# Patient Record
Sex: Female | Born: 1965 | Race: White | Hispanic: No | Marital: Married | Smoking: Former smoker
Health system: Southern US, Community
[De-identification: ages and names within clinical notes are randomized; demographics above are authoritative.]

## PROBLEM LIST (undated history)

## (undated) DIAGNOSIS — I370 Nonrheumatic pulmonary valve stenosis: Secondary | ICD-10-CM

## (undated) DIAGNOSIS — C539 Malignant neoplasm of cervix uteri, unspecified: Secondary | ICD-10-CM

## (undated) HISTORY — PX: CARDIAC SURGERY: SHX584

## (undated) HISTORY — PX: CERVIX SURGERY: SHX593

---

## 2017-03-03 ENCOUNTER — Emergency Department (HOSPITAL_COMMUNITY): Payer: Medicaid Other

## 2017-03-03 ENCOUNTER — Inpatient Hospital Stay (HOSPITAL_COMMUNITY): Payer: Medicaid Other

## 2017-03-03 ENCOUNTER — Encounter (HOSPITAL_COMMUNITY): Payer: Self-pay | Admitting: *Deleted

## 2017-03-03 ENCOUNTER — Inpatient Hospital Stay (HOSPITAL_COMMUNITY)
Admission: EM | Admit: 2017-03-03 | Discharge: 2017-03-08 | DRG: 872 | Disposition: A | Discharge status: Home / Self Care | Payer: Medicaid Other | Attending: Internal Medicine | Admitting: Internal Medicine

## 2017-03-03 ENCOUNTER — Other Ambulatory Visit: Payer: Self-pay

## 2017-03-03 DIAGNOSIS — Z8541 Personal history of malignant neoplasm of cervix uteri: Secondary | ICD-10-CM

## 2017-03-03 DIAGNOSIS — Z791 Long term (current) use of non-steroidal anti-inflammatories (NSAID): Secondary | ICD-10-CM | POA: Diagnosis not present

## 2017-03-03 DIAGNOSIS — C787 Secondary malignant neoplasm of liver and intrahepatic bile duct: Secondary | ICD-10-CM | POA: Diagnosis present

## 2017-03-03 DIAGNOSIS — R7989 Other specified abnormal findings of blood chemistry: Secondary | ICD-10-CM | POA: Diagnosis present

## 2017-03-03 DIAGNOSIS — R160 Hepatomegaly, not elsewhere classified: Secondary | ICD-10-CM

## 2017-03-03 DIAGNOSIS — R945 Abnormal results of liver function studies: Secondary | ICD-10-CM

## 2017-03-03 DIAGNOSIS — R8281 Pyuria: Secondary | ICD-10-CM | POA: Diagnosis present

## 2017-03-03 DIAGNOSIS — E871 Hypo-osmolality and hyponatremia: Secondary | ICD-10-CM | POA: Diagnosis present

## 2017-03-03 DIAGNOSIS — R1011 Right upper quadrant pain: Secondary | ICD-10-CM | POA: Diagnosis present

## 2017-03-03 DIAGNOSIS — E860 Dehydration: Secondary | ICD-10-CM | POA: Diagnosis present

## 2017-03-03 DIAGNOSIS — Z87891 Personal history of nicotine dependence: Secondary | ICD-10-CM

## 2017-03-03 DIAGNOSIS — K760 Fatty (change of) liver, not elsewhere classified: Secondary | ICD-10-CM | POA: Diagnosis present

## 2017-03-03 DIAGNOSIS — N179 Acute kidney failure, unspecified: Secondary | ICD-10-CM | POA: Diagnosis present

## 2017-03-03 DIAGNOSIS — A419 Sepsis, unspecified organism: Secondary | ICD-10-CM | POA: Diagnosis present

## 2017-03-03 DIAGNOSIS — N390 Urinary tract infection, site not specified: Secondary | ICD-10-CM | POA: Diagnosis present

## 2017-03-03 DIAGNOSIS — E872 Acidosis, unspecified: Secondary | ICD-10-CM | POA: Diagnosis present

## 2017-03-03 DIAGNOSIS — K5909 Other constipation: Secondary | ICD-10-CM | POA: Diagnosis present

## 2017-03-03 DIAGNOSIS — Z801 Family history of malignant neoplasm of trachea, bronchus and lung: Secondary | ICD-10-CM

## 2017-03-03 DIAGNOSIS — R918 Other nonspecific abnormal finding of lung field: Secondary | ICD-10-CM | POA: Diagnosis present

## 2017-03-03 DIAGNOSIS — K8020 Calculus of gallbladder without cholecystitis without obstruction: Secondary | ICD-10-CM | POA: Diagnosis present

## 2017-03-03 DIAGNOSIS — C799 Secondary malignant neoplasm of unspecified site: Secondary | ICD-10-CM | POA: Diagnosis present

## 2017-03-03 DIAGNOSIS — C539 Malignant neoplasm of cervix uteri, unspecified: Secondary | ICD-10-CM | POA: Diagnosis present

## 2017-03-03 DIAGNOSIS — D72829 Elevated white blood cell count, unspecified: Secondary | ICD-10-CM | POA: Diagnosis present

## 2017-03-03 DIAGNOSIS — K769 Liver disease, unspecified: Secondary | ICD-10-CM

## 2017-03-03 DIAGNOSIS — R109 Unspecified abdominal pain: Secondary | ICD-10-CM

## 2017-03-03 DIAGNOSIS — I959 Hypotension, unspecified: Secondary | ICD-10-CM | POA: Diagnosis present

## 2017-03-03 HISTORY — DX: Malignant neoplasm of cervix uteri, unspecified: C53.9

## 2017-03-03 HISTORY — DX: Nonrheumatic pulmonary valve stenosis: I37.0

## 2017-03-03 LAB — URINALYSIS, ROUTINE W REFLEX MICROSCOPIC
Bilirubin Urine: NEGATIVE
GLUCOSE, UA: NEGATIVE mg/dL
Ketones, ur: NEGATIVE mg/dL
Leukocytes, UA: NEGATIVE
NITRITE: POSITIVE — AB
PH: 5 (ref 5.0–8.0)
Protein, ur: NEGATIVE mg/dL
SPECIFIC GRAVITY, URINE: 1.013 (ref 1.005–1.030)

## 2017-03-03 LAB — COMPREHENSIVE METABOLIC PANEL
ALBUMIN: 3.4 g/dL — AB (ref 3.5–5.0)
ALBUMIN: 2.8 g/dL — AB (ref 3.5–5.0)
ALK PHOS: 227 U/L — AB (ref 38–126)
ALK PHOS: 181 U/L — AB (ref 38–126)
ALT: 54 U/L (ref 14–54)
ALT: 48 U/L (ref 14–54)
ANION GAP: 16 — AB (ref 5–15)
ANION GAP: 11 (ref 5–15)
AST: 174 U/L — ABNORMAL HIGH (ref 15–41)
AST: 146 U/L — AB (ref 15–41)
BILIRUBIN TOTAL: 2.2 mg/dL — AB (ref 0.3–1.2)
BILIRUBIN TOTAL: 1.6 mg/dL — AB (ref 0.3–1.2)
BUN: 18 mg/dL (ref 6–20)
BUN: 14 mg/dL (ref 6–20)
CALCIUM: 8.8 mg/dL — AB (ref 8.9–10.3)
CALCIUM: 7.8 mg/dL — AB (ref 8.9–10.3)
CO2: 20 mmol/L — ABNORMAL LOW (ref 22–32)
CO2: 21 mmol/L — ABNORMAL LOW (ref 22–32)
CREATININE: 1.3 mg/dL — AB (ref 0.44–1.00)
Chloride: 88 mmol/L — ABNORMAL LOW (ref 101–111)
Chloride: 99 mmol/L — ABNORMAL LOW (ref 101–111)
Creatinine, Ser: 0.99 mg/dL (ref 0.44–1.00)
GFR calc non Af Amer: 46 mL/min — ABNORMAL LOW (ref >60)
GFR calc non Af Amer: >60 mL/min (ref >60)
GFR, EST AFRICAN AMERICAN: 54 mL/min — AB (ref >60)
GLUCOSE: 105 mg/dL — AB (ref 65–99)
Glucose, Bld: 106 mg/dL — ABNORMAL HIGH (ref 65–99)
Potassium: 4.9 mmol/L (ref 3.5–5.1)
Potassium: 4.4 mmol/L (ref 3.5–5.1)
Sodium: 124 mmol/L — ABNORMAL LOW (ref 135–145)
Sodium: 131 mmol/L — ABNORMAL LOW (ref 135–145)
TOTAL PROTEIN: 7.5 g/dL (ref 6.5–8.1)
TOTAL PROTEIN: 6.7 g/dL (ref 6.5–8.1)

## 2017-03-03 LAB — OSMOLALITY, URINE: OSMOLALITY UR: 189 mosm/kg — AB (ref 300–900)

## 2017-03-03 LAB — CBC
HCT: 35.4 % — ABNORMAL LOW (ref 36.0–46.0)
HEMOGLOBIN: 12.1 g/dL (ref 12.0–15.0)
MCH: 26.5 pg (ref 26.0–34.0)
MCHC: 34.2 g/dL (ref 30.0–36.0)
MCV: 77.6 fL — ABNORMAL LOW (ref 78.0–100.0)
PLATELETS: 303 10*3/uL (ref 150–400)
RBC: 4.56 MIL/uL (ref 3.87–5.11)
RDW: 15.6 % — ABNORMAL HIGH (ref 11.5–15.5)
WBC: 23.9 10*3/uL — ABNORMAL HIGH (ref 4.0–10.5)

## 2017-03-03 LAB — LACTIC ACID, PLASMA: Lactic Acid, Venous: 2.9 mmol/L (ref 0.5–1.9)

## 2017-03-03 LAB — I-STAT CG4 LACTIC ACID, ED: Lactic Acid, Venous: 4.55 mmol/L (ref 0.5–1.9)

## 2017-03-03 LAB — PROTIME-INR
INR: 1.18
PROTHROMBIN TIME: 14.9 s (ref 11.4–15.2)

## 2017-03-03 LAB — OSMOLALITY: OSMOLALITY: 281 mosm/kg (ref 275–295)

## 2017-03-03 LAB — LIPASE, BLOOD: Lipase: 23 U/L (ref 11–51)

## 2017-03-03 MED ORDER — CEFTRIAXONE SODIUM 1 G IJ SOLR
1.0000 g | INTRAMUSCULAR | Status: DC
  Administered 2017-03-03 – 2017-03-04 (×2): 1 g via INTRAVENOUS
  Filled 2017-03-03 (×3): qty 10

## 2017-03-03 MED ORDER — SODIUM CHLORIDE 0.9 % IV BOLUS (SEPSIS)
1000.0000 mL | Freq: Once | INTRAVENOUS | Status: AC
  Administered 2017-03-03: 1000 mL via INTRAVENOUS

## 2017-03-03 MED ORDER — POLYETHYLENE GLYCOL 3350 17 G PO PACK
17.0000 g | PACK | Freq: Every day | ORAL | Status: DC
  Filled 2017-03-03 (×2): qty 1

## 2017-03-03 MED ORDER — MORPHINE SULFATE (PF) 4 MG/ML IV SOLN
4.0000 mg | Freq: Once | INTRAVENOUS | Status: AC
  Administered 2017-03-03: 4 mg via INTRAVENOUS
  Filled 2017-03-03: qty 1

## 2017-03-03 MED ORDER — DOCUSATE SODIUM 100 MG PO CAPS
100.0000 mg | ORAL_CAPSULE | Freq: Two times a day (BID) | ORAL | Status: DC
  Administered 2017-03-03: 100 mg via ORAL
  Filled 2017-03-03 (×2): qty 1

## 2017-03-03 MED ORDER — SODIUM CHLORIDE 0.9 % IV BOLUS (SEPSIS)
500.0000 mL | Freq: Once | INTRAVENOUS | Status: AC
  Administered 2017-03-03: 500 mL via INTRAVENOUS

## 2017-03-03 MED ORDER — HEPARIN SODIUM (PORCINE) 5000 UNIT/ML IJ SOLN
5000.0000 [IU] | Freq: Three times a day (TID) | INTRAMUSCULAR | Status: AC
  Administered 2017-03-03 – 2017-03-04 (×3): 5000 [IU] via SUBCUTANEOUS
  Filled 2017-03-03 (×4): qty 1

## 2017-03-03 MED ORDER — ACETAMINOPHEN 325 MG PO TABS: 650.0000 mg | ORAL_TABLET | Freq: Four times a day (QID) | ORAL | Status: DC | PRN

## 2017-03-03 MED ORDER — BISACODYL 10 MG RE SUPP: 10.0000 mg | Freq: Every day | RECTAL | Status: DC | PRN

## 2017-03-03 MED ORDER — PIPERACILLIN-TAZOBACTAM 3.375 G IVPB 30 MIN
3.3750 g | Freq: Once | INTRAVENOUS | Status: AC
  Administered 2017-03-03: 3.375 g via INTRAVENOUS
  Filled 2017-03-03: qty 50

## 2017-03-03 MED ORDER — INFLUENZA VAC SPLIT QUAD 0.5 ML IM SUSY
0.5000 mL | PREFILLED_SYRINGE | INTRAMUSCULAR | Status: DC
  Filled 2017-03-03: qty 0.5

## 2017-03-03 MED ORDER — ONDANSETRON HCL 4 MG/2ML IJ SOLN
4.0000 mg | Freq: Four times a day (QID) | INTRAMUSCULAR | Status: DC | PRN
  Administered 2017-03-03: 4 mg via INTRAVENOUS
  Filled 2017-03-03: qty 2

## 2017-03-03 MED ORDER — ONDANSETRON HCL 4 MG PO TABS: 4.0000 mg | ORAL_TABLET | Freq: Four times a day (QID) | ORAL | Status: DC | PRN

## 2017-03-03 MED ORDER — MORPHINE SULFATE (PF) 4 MG/ML IV SOLN
2.0000 mg | INTRAVENOUS | Status: DC | PRN
  Administered 2017-03-03 – 2017-03-06 (×13): 4 mg via INTRAVENOUS
  Filled 2017-03-03 (×13): qty 1

## 2017-03-03 MED ORDER — GADOBENATE DIMEGLUMINE 529 MG/ML IV SOLN
16.0000 mL | Freq: Once | INTRAVENOUS | Status: AC | PRN
  Administered 2017-03-03: 16 mL via INTRAVENOUS

## 2017-03-03 MED ORDER — ONDANSETRON HCL 4 MG/2ML IJ SOLN
4.0000 mg | Freq: Once | INTRAMUSCULAR | Status: AC
  Administered 2017-03-03: 4 mg via INTRAVENOUS
  Filled 2017-03-03: qty 2

## 2017-03-03 MED ORDER — IOPAMIDOL (ISOVUE-300) INJECTION 61%
INTRAVENOUS | Status: AC
  Filled 2017-03-03: qty 100

## 2017-03-03 MED ORDER — IOPAMIDOL (ISOVUE-300) INJECTION 61%
100.0000 mL | Freq: Once | INTRAVENOUS | Status: AC | PRN
Start: 2017-03-03 — End: 2017-03-03
  Administered 2017-03-03: 100 mL via INTRAVENOUS

## 2017-03-03 MED ORDER — MORPHINE SULFATE (PF) 2 MG/ML IV SOLN: 2.0000 mg | INTRAVENOUS | Status: DC | PRN

## 2017-03-03 MED ORDER — SODIUM CHLORIDE 0.9 % IV SOLN
INTRAVENOUS | Status: DC
  Administered 2017-03-03 – 2017-03-06 (×8): via INTRAVENOUS

## 2017-03-03 MED ORDER — HYDROCODONE-ACETAMINOPHEN 5-325 MG PO TABS
1.0000 | ORAL_TABLET | ORAL | Status: DC | PRN
  Administered 2017-03-03 – 2017-03-04 (×3): 1 via ORAL
  Filled 2017-03-03 (×4): qty 1

## 2017-03-03 MED ORDER — SODIUM CHLORIDE 0.9% FLUSH
3.0000 mL | Freq: Two times a day (BID) | INTRAVENOUS | Status: DC
  Administered 2017-03-05 – 2017-03-07 (×3): 3 mL via INTRAVENOUS

## 2017-03-03 MED ORDER — ACETAMINOPHEN 650 MG RE SUPP: 650.0000 mg | Freq: Four times a day (QID) | RECTAL | Status: DC | PRN

## 2017-03-03 MED ORDER — PROMETHAZINE HCL 25 MG/ML IJ SOLN
12.5000 mg | Freq: Four times a day (QID) | INTRAMUSCULAR | Status: DC | PRN
  Administered 2017-03-03: 12.5 mg via INTRAVENOUS
  Filled 2017-03-03: qty 1

## 2017-03-03 NOTE — ED Notes (Signed)
EDP Tegeler notified of elevated lactic 4.55

## 2017-03-03 NOTE — H&P (Signed)
[facility] History and Physical   Patient: Marisa Kim TKZ:[id number]   PCP: Patient, No Pcp Per DOB: 12-16-1965   DOA: 03/03/2017   DOS: 03/03/2017   DOS: the patient was seen and examined on 03/03/2017  Patient coming from: The patient is coming from home.  Chief Complaint: Right upper quadrant pain  HPI: Marisa Kim is a 51 y.o. female with Past medical history of cervical cancer, pulmonary stenosis S/P open heart surgery at 51 year old age. Patient presents with complaints of right upper quadrant pain. Patient mentions that since last one month she has been having on and off right upper quadrant pain resolving on its own and lasting for a day or 2. Pain was mild and was not bothering her day-to-day activity. At 3 days ago she started having severe pain in the right area along with nausea and the patient had nothing to eat or drink since last 3 days. Patient denies having any complaints of fever or chills. She is unsure whether the pain started after eating heavy fatty meal. She mentions that she has chronic constipation and also currently constipated. She mentions she is passing gas. Denies any blood in the stool denies any cough denies any shortness of breath denies any chest pain. No swelling of her legs. She never had any colonoscopy, never had mammograms. And does not have any recent follow-up for cervical cancer or Pap smear as well.  ED Course: Presents with right upper quadrant pain, ultrasound was performed which was negative for cholecystitis but showed multiple metastatic lesions. CT abdomen was performed which continued to show multiple metastatic lesions without any choledocholithiasis. Lab work was showing lactic acidosis, acute kidney injury, leukocytosis patient was given IV Zosyn and was referred for admission.  At her baseline ambulates without any support And is independent for most of her ADL; manages her medication on her own.  Review of Systems: as  mentioned in the history of present illness.  All other systems reviewed and are negative.  Past Medical History:  Diagnosis Date  . Cervical ca ([place])   . Pulmonary stenosis    Past Surgical History:  Procedure Laterality Date  . CARDIAC SURGERY    . CERVIX SURGERY     30 years ago   Social History:  reports that she quit smoking about 7 years ago. She has a 60.00 pack-year smoking history. She has never used smokeless tobacco. She reports that she drinks alcohol. She reports that she does not use drugs.  No Known Allergies   Family History  Problem Relation Age of Onset  . Lung cancer Mother 31     Prior to Admission medications   Medication Sig Start Date End Date Taking? Authorizing Provider  naproxen sodium (ANAPROX) 220 MG tablet Take 220 mg by mouth 2 (two) times daily as needed (pain).   Yes [provider]    Physical Exam: Vitals:   03/03/17 0753 03/03/17 0807 03/03/17 1225 03/03/17 1611  BP: (!) 187/98  125/67 (!) 132/92  Pulse: 91  98 85  Resp: 18  (!) 28 16  Temp: 97.9 F (36.6 C)   98.1 F (36.7 C)  TempSrc:    Oral  SpO2: 97%  97% 97%  Weight:  77.1 kg (170 lb)    Height:  5' (1.524 m)  5' (1.524 m)    General: Alert, Awake and Oriented to Time, Place and Person. Appear in moderate distress, affect appropriate Eyes: PERRL, Conjunctiva normal ENT: Oral Mucosa clear moist. Neck: no  JVD, no Abnormal Mass Or lumps Cardiovascular: S1 and S2 Present, no Murmur, Peripheral Pulses Present Respiratory: normal respiratory effort, Bilateral Air entry equal and Decreased, no use of accessory muscle, Clear to Auscultation, no Crackles, no wheezes Abdomen: Bowel Sound present, Soft and mild right upper quadrant and epifgastric  tenderness, no hernia Skin: no redness, no Rash, no induration Extremities: trace Pedal edema, no calf tenderness Neurologic: Grossly no focal neuro deficit. Bilaterally Equal motor strength  Labs on Admission:  CBC:  Recent  Labs Lab 03/03/17 0821  WBC 23.9*  HGB 12.1  HCT 35.4*  MCV 77.6*  PLT 149   Basic Metabolic Panel:  Recent Labs Lab 03/03/17 0821  NA 124*  K 4.9  CL 88*  CO2 20*  GLUCOSE 105*  BUN 18  CREATININE 1.30*  CALCIUM 8.8*   GFR: Estimated Creatinine Clearance: 46.4 mL/min (A) (by C-G formula based on SCr of 1.3 mg/dL (H)). Liver Function Tests:  Recent Labs Lab 03/03/17 0821  AST 174*  ALT 54  ALKPHOS 227*  BILITOT 2.2*  PROT 7.5  ALBUMIN 3.4*    Recent Labs Lab 03/03/17 0821  LIPASE 23   No results for input(s): AMMONIA in the last 168 hours. Coagulation Profile:  Recent Labs Lab 03/03/17 0821  INR 1.18   Cardiac Enzymes: No results for input(s): CKTOTAL, CKMB, CKMBINDEX, TROPONINI in the last 168 hours. BNP (last 3 results) No results for input(s): PROBNP in the last 8760 hours. HbA1C: No results for input(s): HGBA1C in the last 72 hours. CBG: No results for input(s): GLUCAP in the last 168 hours. Lipid Profile: No results for input(s): CHOL, HDL, LDLCALC, TRIG, CHOLHDL, LDLDIRECT in the last 72 hours. Thyroid Function Tests: No results for input(s): TSH, T4TOTAL, FREET4, T3FREE, THYROIDAB in the last 72 hours. Anemia Panel: No results for input(s): VITAMINB12, FOLATE, FERRITIN, TIBC, IRON, RETICCTPCT in the last 72 hours. Urine analysis:    Component Value Date/Time   COLORURINE YELLOW 03/03/2017 1225   APPEARANCEUR CLEAR 03/03/2017 1225   LABSPEC 1.013 03/03/2017 1225   PHURINE 5.0 03/03/2017 1225   GLUCOSEU NEGATIVE 03/03/2017 1225   HGBUR SMALL (A) 03/03/2017 1225   BILIRUBINUR NEGATIVE 03/03/2017 1225   KETONESUR NEGATIVE 03/03/2017 1225   PROTEINUR NEGATIVE 03/03/2017 1225   NITRITE POSITIVE (A) 03/03/2017 1225   LEUKOCYTESUR NEGATIVE 03/03/2017 1225    Radiological Exams on Admission: Dg Chest 2 View  Result Date: 03/03/2017 CLINICAL DATA:  51 year old female with a history of leukocytosis. No current chest complaints EXAM:  CHEST  2 VIEW COMPARISON:  None. FINDINGS: The heart size and mediastinal contours are within normal limits. Both lungs are clear. Healed right-sided rib fractures. IMPRESSION: No radiographic evidence of acute cardiopulmonary disease Electronically Signed   By: Corrie Mckusick D.O.   On: 03/03/2017 11:01   Ct Chest Wo Contrast  Result Date: 03/03/2017 CLINICAL DATA:  Lung mass. EXAM: CT CHEST WITHOUT CONTRAST TECHNIQUE: Multidetector CT imaging of the chest was performed following the standard protocol without IV contrast. COMPARISON:  CT scan and radiographs of same day. FINDINGS: Cardiovascular: Atherosclerosis of thoracic aorta is noted without aneurysm formation. Enlarged pulmonary artery is noted suggesting pulmonary artery hypertension. Coronary artery calcifications are noted. No pericardial effusion is noted. Mediastinum/Nodes: Thyroid gland and esophagus appear normal. 1 cm pretracheal lymph node is noted concerning for metastatic disease per 1 cm right axillary lymph node is noted concerning for possible metastatic disease. Lungs/Pleura: No pneumothorax or pleural effusion is noted. Left lung is clear. 1.9  x 1.6 cm spiculated density with pleural tail is noted in the right lower lobe posteriorly concerning for malignancy. Upper Abdomen: Multiple low densities are noted in hepatic parenchyma concerning for metastatic disease. Musculoskeletal: No chest wall mass or suspicious bone lesions identified. IMPRESSION: 1.9 x 1.6 cm spiculated density seen in right lower lobe is noted concerning for malignancy. PET scan is recommended for further evaluation. Enlarged pretracheal and right axillary adenopathy is noted concerning for possible metastatic disease. Hepatic metastatic lesions are again noted. Enlarged pulmonary artery is noted suggesting pulmonary artery hypertension. Coronary artery calcifications are noted. Aortic Atherosclerosis (ICD10-I70.0). Electronically Signed   By: Marijo Conception, M.D.   On:  03/03/2017 14:23   Ct Abdomen Pelvis W Contrast  Result Date: 03/03/2017 CLINICAL DATA:  Nausea, acute vomiting, non ileus. EXAM: CT ABDOMEN AND PELVIS WITH CONTRAST TECHNIQUE: Multidetector CT imaging of the abdomen and pelvis was performed using the standard protocol following bolus administration of intravenous contrast. CONTRAST:  137mL ISOVUE-300 IOPAMIDOL (ISOVUE-300) INJECTION 61% COMPARISON:  None. FINDINGS: Lower chest: Elongated irregular nodular density aligned along the bronchovascular tree in the right lower lobe with dominant nodular component measuring 21 mm. Hepatobiliary: The liver is enlarged and lobulated. There are numerous areas of low-density, some with a rounded masslike appearance and others more confluent in the subcapsular far right and left lobes. Suspect this is hypoenhancing metastatic disease with superimposed steatosis. Cholelithiasis.No evidence of biliary obstruction or stone. Pancreas: Unremarkable. Spleen: Unremarkable. Adrenals/Urinary Tract: Negative adrenals. No hydronephrosis or stone. Unremarkable bladder. Stomach/Bowel: No primary mass lesion is seen. Mild distal colonic diverticulosis. No appendicitis. Vascular/Lymphatic: No acute vascular abnormality. No mass or adenopathy. Reproductive:No pathologic findings. Other: Trace pelvic fluid. Musculoskeletal: No acute abnormalities. Smoothly contoured subcutaneous nodule in the right gluteal fat is likely a dermal inclusion cyst. IMPRESSION: 1. Numerous hepatic masses with a metastatic pattern. No clear primary. There is superimposed patchy hepatic steatosis and if biopsy is needed a preoperative MRI could ensure well directed biopsy. 2. Elongated spiculated nodule in the right lower lobe with dominant nodular component measuring 2 cm. This could be a primary or metastatic neoplasm. Full chest CT or PET recommended. 3. Cholelithiasis without cholecystitis. Electronically Signed   By: Monte Fantasia M.D.   On: 03/03/2017  12:30   US Abdomen Limited Ruq  Result Date: 03/03/2017 CLINICAL DATA:  Right upper quadrant pain and nausea EXAM: ULTRASOUND ABDOMEN LIMITED RIGHT UPPER QUADRANT COMPARISON:  None. FINDINGS: Gallbladder: Layering gallstone without wall thickening focal tenderness. Common bile duct: Diameter: 4 mm Liver: Diffuse liver heterogeneity and lobulation with the appearance of multiple masses. Patent and antegrade flow in the main portal vein. Portal vein is patent on color Doppler imaging with normal direction of blood flow towards the liver. IMPRESSION: 1. Very heterogeneous liver, likely widespread hepatic metastatic disease. Recommend abdomen and pelvis CT with contrast. 2. Cholelithiasis without cholecystitis. Electronically Signed   By: Monte Fantasia M.D.   On: 03/03/2017 10:49   EKG: Independently reviewed. normal sinus rhythm, nonspecific ST and T waves changes.  Assessment/Plan 1. Right upper quadrant pain. Cholelithiasis. Patient presented with complaints of 3 days of right upper quadrant pain with nausea and vomiting poor by mouth intake. Patient is to be significantly dehydrated. Patient has cholelithiasis on the CT scan. Discussed with general surgery recommended no surgical intervention for now as they do not think that patient's pain is coming from cholelithiasis. No evidence of CBD involvement or choledocholithiasis or cholecystitis on ultrasound.  2. SIRS. Possible UTI.  Patient with tachycardia, leukocytosis, tachypnea, possible UTI meets sepsis criteria although does not have any fever or chills. I suspect that the leukocytosis is primarily driven by severe dehydration but a possibility of infection cannot be ruled out. We will give her IV ceftriaxone to cover empirically until the workup is negative.  3. Metastatic liver lesions. Right lower lobe spiculated mass 2 cm in size. History of cervical cancer. Patient has not have any colonoscopy or mammogram. She had history of  cervical cancer 30 years ago for which she underwent surgery but does not have any follow-up about that as well. Presents with right upper quadrant pain and ultrasound as well as CT scan is showing multiple metastatic liver lesions. Primary is unclear but possible lung given the presence of a right lung mass. IR consulted for CT-guided biopsy of the liver. MRI ordered as per recommendation from radiology.  4. Hyponatremia. Likely from dehydration and poor by mouth intake. Continue IV hydration and recheck BMP. Also check osmolality,  5. Elevated LFT. Monitor. Avoid liver toxic medications.  Nutrition: Regular diet clear liquids for now. DVT Prophylaxis: subcutaneous Heparin  Advance goals of care discussion: Full code   Consults: IR  Family Communication: family was present at bedside, at the time of interview.  Opportunity was given to ask question and all questions were answered satisfactorily.  Disposition: Admitted as inpatient, tmed-surge unit. Likely to be discharged home, in 3-4 days.  Author: Berle Mull, MD [facility] Pager: [phone] 03/03/2017  If 7PM-7AM, please contact night-coverage [email or web address] Password [email or web address]

## 2017-03-03 NOTE — Progress Notes (Signed)
CRITICAL VALUE ALERT  Critical Value: Lactic acid 2.9 Date & Time Notied:  9/29 @ 1750  Provider Notified: Dr Posey Pronto   Orders Received/Actions taken:Return call at 1800 Dr Posey Pronto

## 2017-03-03 NOTE — ED Triage Notes (Signed)
Pin in rt abd near flank are with nausea and vomiting, has become worse last few days, Radiates toward rt neck area

## 2017-03-03 NOTE — ED Provider Notes (Signed)
[place] DEPT Provider Note   CSN: [id number] Arrival date & time: 03/03/17  0745     History   Chief Complaint Chief Complaint  Patient presents with  . Abdominal Pain  . Nausea  . Emesis    HPI Marisa Kim is a 51 y.o. female.  The history is provided by the patient and medical records. No language interpreter was used.  Abdominal Pain   This is a new problem. The current episode started more than 2 days ago. The problem occurs constantly. The problem has not changed since onset.The pain is associated with an unknown factor. The pain is located in the RUQ. The quality of the pain is aching, cramping and sharp. The pain is at a severity of 10/10. The pain is severe. Associated symptoms include nausea and vomiting. Pertinent negatives include fever, diarrhea, melena, constipation, dysuria, hematuria and headaches. Nothing aggravates the symptoms. Nothing relieves the symptoms.    Past Medical History:  Diagnosis Date  . Cervical ca ([place])   . Pulmonary stenosis     There are no active problems to display for this patient.   Past Surgical History:  Procedure Laterality Date  . CARDIAC SURGERY    . CERVIX SURGERY      OB History    No data available       Home Medications    Prior to Admission medications   Not on File    Family History No family history on file.  Social History Social History  Substance Use Topics  . Smoking status: Never Smoker  . Smokeless tobacco: Never Used  . Alcohol use Yes     Comment: occ     Allergies   Patient has no known allergies.   Review of Systems Review of Systems  Constitutional: Negative for chills, diaphoresis, fatigue and fever.  HENT: Negative for congestion and rhinorrhea.   Respiratory: Negative for cough, chest tightness, shortness of breath, wheezing and stridor.   Cardiovascular: Negative for chest pain and palpitations.  Gastrointestinal: Positive for abdominal pain, nausea and vomiting.  Negative for abdominal distention, constipation, diarrhea and melena.  Genitourinary: Negative for dysuria, enuresis, flank pain and hematuria.  Musculoskeletal: Negative for back pain, neck pain and neck stiffness.  Skin: Negative for rash and wound.  Neurological: Negative for weakness, light-headedness and headaches.  Psychiatric/Behavioral: Negative for agitation.  All other systems reviewed and are negative.    Physical Exam Updated Vital Signs BP (!) 187/98   Pulse 91   Temp 97.9 F (36.6 C)   Resp 18   Ht 5' (1.524 m)   Wt 77.1 kg (170 lb)   SpO2 97%   BMI 33.20 kg/m   Physical Exam  Constitutional: She is oriented to person, place, and time. She appears well-developed and well-nourished. No distress.  HENT:  Head: Normocephalic and atraumatic.  Mouth/Throat: Oropharynx is clear and moist. No oropharyngeal exudate.  Eyes: Pupils are equal, round, and reactive to light. Conjunctivae and EOM are normal.  Neck: Normal range of motion. Neck supple.  Cardiovascular: Normal rate and regular rhythm.   Murmur heard. Pulmonary/Chest: Effort normal and breath sounds normal. No stridor. No respiratory distress. She has no wheezes. She exhibits no tenderness.  Abdominal: Soft. Normal appearance. There is tenderness in the right upper quadrant and epigastric area. There is no rigidity, no rebound, no guarding and no CVA tenderness.    Musculoskeletal: She exhibits no edema or tenderness.  Neurological: She is alert and oriented to person, place, and  time. No sensory deficit. She exhibits normal muscle tone.  Skin: Skin is warm and dry. Capillary refill takes less than 2 seconds. No rash noted. She is not diaphoretic. No erythema.  Psychiatric: She has a normal mood and affect.  Nursing note and vitals reviewed.    ED Treatments / Results  Labs (all labs ordered are listed, but only abnormal results are displayed) Labs Reviewed  COMPREHENSIVE METABOLIC PANEL - Abnormal;  Notable for the following:       Result Value   Sodium 124 (*)    Chloride 88 (*)    CO2 20 (*)    Glucose, Bld 105 (*)    Creatinine, Ser 1.30 (*)    Calcium 8.8 (*)    Albumin 3.4 (*)    AST 174 (*)    Alkaline Phosphatase 227 (*)    Total Bilirubin 2.2 (*)    GFR calc non Af Amer 46 (*)    GFR calc Af Amer 54 (*)    Anion gap 16 (*)    All other components within normal limits  CBC - Abnormal; Notable for the following:    WBC 23.9 (*)    HCT 35.4 (*)    MCV 77.6 (*)    RDW 15.6 (*)    All other components within normal limits  URINALYSIS, ROUTINE W REFLEX MICROSCOPIC - Abnormal; Notable for the following:    Hgb urine dipstick SMALL (*)    Nitrite POSITIVE (*)    Bacteria, UA MANY (*)    Squamous Epithelial / LPF 0-5 (*)    All other components within normal limits  I-STAT CG4 LACTIC ACID, ED - Abnormal; Notable for the following:    Lactic Acid, Venous 4.55 (*)    All other components within normal limits  CULTURE, BLOOD (ROUTINE X 2)  CULTURE, BLOOD (ROUTINE X 2)  LIPASE, BLOOD  PROTIME-INR  I-STAT CG4 LACTIC ACID, ED    EKG  EKG Interpretation None       Radiology Dg Chest 2 View  Result Date: 03/03/2017 CLINICAL DATA:  51 year old female with a history of leukocytosis. No current chest complaints EXAM: CHEST  2 VIEW COMPARISON:  None. FINDINGS: The heart size and mediastinal contours are within normal limits. Both lungs are clear. Healed right-sided rib fractures. IMPRESSION: No radiographic evidence of acute cardiopulmonary disease Electronically Signed   By: Corrie Mckusick D.O.   On: 03/03/2017 11:01   Ct Abdomen Pelvis W Contrast  Result Date: 03/03/2017 CLINICAL DATA:  Nausea, acute vomiting, non ileus. EXAM: CT ABDOMEN AND PELVIS WITH CONTRAST TECHNIQUE: Multidetector CT imaging of the abdomen and pelvis was performed using the standard protocol following bolus administration of intravenous contrast. CONTRAST:  144mL ISOVUE-300 IOPAMIDOL (ISOVUE-300)  INJECTION 61% COMPARISON:  None. FINDINGS: Lower chest: Elongated irregular nodular density aligned along the bronchovascular tree in the right lower lobe with dominant nodular component measuring 21 mm. Hepatobiliary: The liver is enlarged and lobulated. There are numerous areas of low-density, some with a rounded masslike appearance and others more confluent in the subcapsular far right and left lobes. Suspect this is hypoenhancing metastatic disease with superimposed steatosis. Cholelithiasis.No evidence of biliary obstruction or stone. Pancreas: Unremarkable. Spleen: Unremarkable. Adrenals/Urinary Tract: Negative adrenals. No hydronephrosis or stone. Unremarkable bladder. Stomach/Bowel: No primary mass lesion is seen. Mild distal colonic diverticulosis. No appendicitis. Vascular/Lymphatic: No acute vascular abnormality. No mass or adenopathy. Reproductive:No pathologic findings. Other: Trace pelvic fluid. Musculoskeletal: No acute abnormalities. Smoothly contoured subcutaneous nodule in the right  gluteal fat is likely a dermal inclusion cyst. IMPRESSION: 1. Numerous hepatic masses with a metastatic pattern. No clear primary. There is superimposed patchy hepatic steatosis and if biopsy is needed a preoperative MRI could ensure well directed biopsy. 2. Elongated spiculated nodule in the right lower lobe with dominant nodular component measuring 2 cm. This could be a primary or metastatic neoplasm. Full chest CT or PET recommended. 3. Cholelithiasis without cholecystitis. Electronically Signed   By: Monte Fantasia M.D.   On: 03/03/2017 12:30   US Abdomen Limited Ruq  Result Date: 03/03/2017 CLINICAL DATA:  Right upper quadrant pain and nausea EXAM: ULTRASOUND ABDOMEN LIMITED RIGHT UPPER QUADRANT COMPARISON:  None. FINDINGS: Gallbladder: Layering gallstone without wall thickening focal tenderness. Common bile duct: Diameter: 4 mm Liver: Diffuse liver heterogeneity and lobulation with the appearance of multiple  masses. Patent and antegrade flow in the main portal vein. Portal vein is patent on color Doppler imaging with normal direction of blood flow towards the liver. IMPRESSION: 1. Very heterogeneous liver, likely widespread hepatic metastatic disease. Recommend abdomen and pelvis CT with contrast. 2. Cholelithiasis without cholecystitis. Electronically Signed   By: Monte Fantasia M.D.   On: 03/03/2017 10:49    Procedures Procedures (including critical care time)   Medications Ordered in ED Medications  iopamidol (ISOVUE-300) 61 % injection (not administered)  morphine 4 MG/ML injection 4 mg (not administered)  sodium chloride 0.9 % bolus 1,000 mL (0 mLs Intravenous Stopped 03/03/17 1102)  morphine 4 MG/ML injection 4 mg (4 mg Intravenous Given 03/03/17 0958)  ondansetron (ZOFRAN) injection 4 mg (4 mg Intravenous Given 03/03/17 0958)  sodium chloride 0.9 % bolus 1,000 mL (0 mLs Intravenous Stopped 03/03/17 1310)    And  sodium chloride 0.9 % bolus 500 mL (0 mLs Intravenous Stopped 03/03/17 1310)  piperacillin-tazobactam (ZOSYN) IVPB 3.375 g (0 g Intravenous Stopped 03/03/17 1310)  iopamidol (ISOVUE-300) 61 % injection 100 mL (100 mLs Intravenous Contrast Given 03/03/17 1139)     Initial Impression / Assessment and Plan / ED Course  I have reviewed the triage vital signs and the nursing notes.  Pertinent labs & imaging results that were available during my care of the patient were reviewed by me and considered in my medical decision making (see chart for details).     Marisa Kim is a 51 y.o. female With a past medical history significant for pulmonary stenosis and prior cervical cancer who presents with 3 days of right upper quadrant abdominal pain, nausea, and vomiting. Patient says that for the last month she had several "twinges" in her right upper quadrant but nothing that was sustained. She says that over the last 3 days, she has developed significant pain in the right upper quadrant  radiating around to the right flank. She reports numerous episodes of nonbloody nonbilious nausea and vomiting. She denies constipation or diarrhea. She denies any urinary symptoms. She denies any chest pain, shortness of breath, or cough. She reports that the right upper quadrant does hurt when she takes a deep breath. She reports no fevers or chills. She denies recent traumatic injuries. She denies taking any medicine to help her symptoms.  On exam, patient has tenderness in the epigastrium and right upper quadrant. Patient has no other tenderness in the abdomen. Patient has no CVA tenderness. Lungs are clear. Chest is nontender. Murmur present. Patient has a scar on her chest from prior pulmonary stenosis surgery. Patient has no significant lower extremity tenderness or edema.  While in triage, based  on the triage complaining of right upper quadrant abdominal pain, patient had laboratory testing and ultrasound performed. Patient's numbers begin to returned showing elevated liver function testing, elevated alkaline phosphatase. Patient's lactic acid was also elevated at 4.55. Given the patient's leukocytosis that also returned at 23 and the patient's symptoms, patient will be empirically treated for intra-abdominal infection and a code sepsis was called.   Patient had a right upper quadrant ultrasound obtained which showed no evidence of acute cholecystitis but does show concern for metastasis throughout the liver versus heterogenicity of some kind. A CT was recommended and this was ordered. Chest x-ray also obtained. X-ray showed no acute cardiopulmonary disease.  Patient felt somewhat improved after pain medicine, nausea medicine, and fluids. Dehydration may have contributed to the lactic acidosis however, they're still concern for intra-abdominal pathology. Anticipate reassessment after CT imaging.   1:10 PM CT imaging showed concern for metastatic disease in the liver and possibly in the lung.  Urinalysis returned showing nitrites and bacteria. This is concerning for UTI as the cause of her sepsis.  Hospitalist team was called for further management of the possible liver metastasis, sepsis with likely UTI source, abdominal pain, and nausea/vomiting.     Final Clinical Impressions(s) / ED Diagnoses   Final diagnoses:  RUQ pain  Right upper quadrant abdominal pain  Sepsis, due to unspecified organism [facility])  Liver masses     Clinical Impression: 1. Right upper quadrant abdominal pain   2. RUQ pain   3. Sepsis, due to unspecified organism ([place])   4. Liver masses     Disposition: Admit to Hospitalist service    Mujtaba Bollig, Gwenyth Allegra, MD 03/03/17 1745

## 2017-03-03 NOTE — ED Notes (Signed)
PT aware of urine sample. Unable to provide at this time

## 2017-03-03 NOTE — ED Notes (Signed)
Bed: AY04 Expected date:  Expected time:  Means of arrival:  Comments: Tr 6

## 2017-03-03 NOTE — Progress Notes (Signed)
A consult was received from an ED physician for Zosyn per pharmacy dosing.  The patient's profile has been reviewed for ht/wt/allergies/indication/available labs.   A one time order has been placed for Zosyn 3.375gm IV.  Further antibiotics/pharmacy consults should be ordered by admitting physician if indicated.                       Thank you,  Leone Haven, PharmD 03/03/17 @ 10:42

## 2017-03-04 LAB — CBC WITH DIFFERENTIAL/PLATELET
BASOS ABS: 0 10*3/uL (ref 0.0–0.1)
BASOS PCT: 0 %
EOS ABS: 0.2 10*3/uL (ref 0.0–0.7)
Eosinophils Relative: 1 %
HCT: 33.1 % — ABNORMAL LOW (ref 36.0–46.0)
Hemoglobin: 10.8 g/dL — ABNORMAL LOW (ref 12.0–15.0)
Lymphocytes Relative: 10 %
Lymphs Abs: 1.6 10*3/uL (ref 0.7–4.0)
MCH: 26 pg (ref 26.0–34.0)
MCHC: 32.6 g/dL (ref 30.0–36.0)
MCV: 79.8 fL (ref 78.0–100.0)
MONO ABS: 1.6 10*3/uL — AB (ref 0.1–1.0)
MONOS PCT: 10 %
NEUTROS ABS: 13.3 10*3/uL — AB (ref 1.7–7.7)
NEUTROS PCT: 79 %
PLATELETS: 274 10*3/uL (ref 150–400)
RBC: 4.15 MIL/uL (ref 3.87–5.11)
RDW: 16 % — AB (ref 11.5–15.5)
WBC: 16.8 10*3/uL — ABNORMAL HIGH (ref 4.0–10.5)

## 2017-03-04 LAB — COMPREHENSIVE METABOLIC PANEL
ALK PHOS: 187 U/L — AB (ref 38–126)
ALT: 48 U/L (ref 14–54)
ANION GAP: 10 (ref 5–15)
AST: 164 U/L — ABNORMAL HIGH (ref 15–41)
Albumin: 2.8 g/dL — ABNORMAL LOW (ref 3.5–5.0)
BILIRUBIN TOTAL: 1.7 mg/dL — AB (ref 0.3–1.2)
BUN: 14 mg/dL (ref 6–20)
CALCIUM: 7.5 mg/dL — AB (ref 8.9–10.3)
CO2: 22 mmol/L (ref 22–32)
CREATININE: 0.88 mg/dL (ref 0.44–1.00)
Chloride: 100 mmol/L — ABNORMAL LOW (ref 101–111)
GFR calc non Af Amer: >60 mL/min (ref >60)
GLUCOSE: 80 mg/dL (ref 65–99)
Potassium: 4.3 mmol/L (ref 3.5–5.1)
SODIUM: 132 mmol/L — AB (ref 135–145)
TOTAL PROTEIN: 6.6 g/dL (ref 6.5–8.1)

## 2017-03-04 LAB — PROTIME-INR
INR: 1.34
PROTHROMBIN TIME: 16.4 s — AB (ref 11.4–15.2)

## 2017-03-04 LAB — TROPONIN I

## 2017-03-04 LAB — MAGNESIUM: MAGNESIUM: 1.5 mg/dL — AB (ref 1.7–2.4)

## 2017-03-04 LAB — HIV ANTIBODY (ROUTINE TESTING W REFLEX): HIV SCREEN 4TH GENERATION: NONREACTIVE

## 2017-03-04 LAB — LACTIC ACID, PLASMA: Lactic Acid, Venous: 2.8 mmol/L (ref 0.5–1.9)

## 2017-03-04 MED ORDER — HEPARIN SODIUM (PORCINE) 5000 UNIT/ML IJ SOLN: 5000.0000 [IU] | Freq: Three times a day (TID) | INTRAMUSCULAR | Status: DC

## 2017-03-04 MED ORDER — MAGNESIUM CITRATE PO SOLN
1.0000 | Freq: Once | ORAL | Status: AC
  Administered 2017-03-04: 1 via ORAL
  Filled 2017-03-04: qty 296

## 2017-03-04 MED ORDER — ALUM & MAG HYDROXIDE-SIMETH 200-200-20 MG/5ML PO SUSP
30.0000 mL | Freq: Four times a day (QID) | ORAL | Status: DC | PRN
  Administered 2017-03-04 – 2017-03-07 (×4): 30 mL via ORAL
  Filled 2017-03-04 (×4): qty 30

## 2017-03-04 MED ORDER — MAGNESIUM SULFATE 2 GM/50ML IV SOLN
2.0000 g | Freq: Once | INTRAVENOUS | Status: AC
  Administered 2017-03-04: 2 g via INTRAVENOUS
  Filled 2017-03-04: qty 50

## 2017-03-04 NOTE — Progress Notes (Signed)
CRITICAL VALUE ALERT  Critical Value:  2.8  Date & Time Notied:  03/04/17 at 0600  Provider Notified: Kennon Holter  Orders Received/Actions taken: N/A  (detailed message sent but value is lower than previous value; MD aware).

## 2017-03-04 NOTE — Progress Notes (Signed)
[facility] Progress Note  Patient: Marisa Kim TDV:[id number]   PCP: Patient, No Pcp Per DOB: August 26, 1965   DOA: 03/03/2017   DOS: 03/04/2017   Date of Service: the patient was seen and examined on 03/04/2017  Subjective: Continues to have abdominal pain. No vomiting this morning but did have multiple episodes of vomiting yesterday. Passing gas, no bowel movement. Tolerating clear liquids this morning.  Brief hospital course: Pt. with PMH of cervical cancer, pulmonary stenosis S/P open heart surgery at 51 years old age; admitted on 03/03/2017, presented with complaint of abdominal pain, was found to have hepatic mass likely metastasis, right lung mass, cholelithiasis, constipation. Currently further plan is continue further workup of the hepatic mass and treating the pain.  Assessment and Plan: 1. Right upper quadrant pain. Cholelithiasis. Patient presented with complaints of 3 days of right upper quadrant pain with nausea and vomiting poor by mouth intake. Patient is to be significantly dehydrated. Patient has cholelithiasis on the CT scan. Discussed with general surgery recommended no surgical intervention for now as they do not think that patient's pain is coming from cholelithiasis. No evidence of CBD involvement or choledocholithiasis or cholecystitis on ultrasound.  2. SIRS. Possible UTI. Patient with tachycardia, leukocytosis, tachypnea, possible UTI meets sepsis criteria although does not have any fever or chills. I suspect that the leukocytosis is primarily driven by severe dehydration but a possibility of infection cannot be ruled out. We will give her IV ceftriaxone to cover empirically until the workup is negative.  3. Metastatic liver lesions. Right lower lobe spiculated mass 2 cm in size. History of cervical cancer. Patient has not have any colonoscopy or mammogram. She had history of cervical cancer 30 years ago for which she underwent surgery but does not have  any follow-up about that as well. Presents with right upper quadrant pain and ultrasound as well as CT scan is showing multiple metastatic liver lesions. Primary is unclear but possible lung given the presence of a right lung mass. IR consulted for CT-guided biopsy of the liver. MRI liver suggest multiple hepatic lesions and concerning for metastasis. Even after the biopsy patient will need screening mammogram and colonoscopy as an outpatient.  4. Hyponatremia. Likely from dehydration and poor by mouth intake. Now almost normalized, continue IV hydration.  5. Elevated LFT. Monitor. Avoid liver toxic medications  Diet: Advance to regular diet DVT Prophylaxis: subcutaneous Heparin  Advance goals of care discussion: Full code  Family Communication: family was present at bedside, at the time of interview. The pt provided permission to discuss medical plan with the family. Opportunity was given to ask question and all questions were answered satisfactorily.   Disposition:  Discharge to home.  Consultants: IR Procedures: none  Antibiotics: Anti-infectives    Start     Dose/Rate Route Frequency Ordered Stop   03/03/17 1500  cefTRIAXone (ROCEPHIN) 1 g in dextrose 5 % 50 mL IVPB     1 g 100 mL/hr over 30 Minutes Intravenous Every 24 hours 03/03/17 1356     03/03/17 1045  piperacillin-tazobactam (ZOSYN) IVPB 3.375 g     3.375 g 100 mL/hr over 30 Minutes Intravenous  Once 03/03/17 1037 03/03/17 1310       Objective: Physical Exam: Vitals:   03/03/17 1611 03/03/17 2110 03/04/17 0500 03/04/17 1338  BP: (!) 132/92 104/62 127/75 (!) 147/87  Pulse: 85 76 83 85  Resp: 16 16 16 16   Temp: 98.1 F (36.7 C) (!) 97.5 F (36.4 C) 98 F (36.7  C) 98.1 F (36.7 C)  TempSrc: Oral Oral Oral Oral  SpO2: 97% 93% 96% 100%  Weight: 81.2 kg (179 lb)     Height: 5' (1.524 m)       Intake/Output Summary (Last 24 hours) at 03/04/17 1531 Last data filed at 03/04/17 1339  Gross per 24 hour    Intake           2447.5 ml  Output                0 ml  Net           2447.5 ml   Filed Weights   03/03/17 0807 03/03/17 1611  Weight: 77.1 kg (170 lb) 81.2 kg (179 lb)   General: Alert, Awake and Oriented to Time, Place and Person. Appear in mild distress, affect appropriate Eyes: PERRL, Conjunctiva normal ENT: Orl Mucosa clear moist Neck: no JVD, no Abnormal Mass Or lumps Cardiovascular: S1 and S2 Present, no Murmur, Peripheral Pulses Present Respiratory: normal respiratory effort, Bilateral Air entry equal and Decreased, no use of accessory muscle, Clear to Auscultation, no Crackles, no wheezes Abdomen: Bowel Sound present, Soft and no tenderness, no hernia Skin: no redness, no Rash, no induration Extremities: no Pedal edema, no calf tenderness Neurologic: Grossly no focal neuro deficit. Bilaterally Equal motor strength  Data Reviewed: CBC:  Recent Labs Lab 03/03/17 0821 03/04/17 0458  WBC 23.9* 16.8*  NEUTROABS  --  13.3*  HGB 12.1 10.8*  HCT 35.4* 33.1*  MCV 77.6* 79.8  PLT 303 179   Basic Metabolic Panel:  Recent Labs Lab 03/03/17 0821 03/03/17 1850 03/04/17 0458  NA 124* 131* 132*  K 4.9 4.4 4.3  CL 88* 99* 100*  CO2 20* 21* 22  GLUCOSE 105* 106* 80  BUN 18 14 14   CREATININE 1.30* 0.99 0.88  CALCIUM 8.8* 7.8* 7.5*  MG  --   --  1.5*    Liver Function Tests:  Recent Labs Lab 03/03/17 0821 03/03/17 1850 03/04/17 0458  AST 174* 146* 164*  ALT 54 48 48  ALKPHOS 227* 181* 187*  BILITOT 2.2* 1.6* 1.7*  PROT 7.5 6.7 6.6  ALBUMIN 3.4* 2.8* 2.8*    Recent Labs Lab 03/03/17 0821  LIPASE 23   No results for input(s): AMMONIA in the last 168 hours. Coagulation Profile:  Recent Labs Lab 03/03/17 0821 03/04/17 0458  INR 1.18 1.34   Cardiac Enzymes:  Recent Labs Lab 03/04/17 0458  TROPONINI <0.03   BNP (last 3 results) No results for input(s): PROBNP in the last 8760 hours. CBG: No results for input(s): GLUCAP in the last 168  hours. Studies: Mr Liver W Wo Contrast  Result Date: 03/03/2017 CLINICAL DATA:  Right upper quadrant abdominal pain. Numerous liver lesions on ultrasound and CT identified suspicious for widespread hepatic metastatic disease. Spiculated right lower lobe pulmonary nodule identified on CT. Remote history of cervical cancer. Former smoker. EXAM: MRI ABDOMEN WITHOUT AND WITH CONTRAST TECHNIQUE: Multiplanar multisequence MR imaging of the abdomen was performed both before and after the administration of intravenous contrast. CONTRAST:  4mL MULTIHANCE GADOBENATE DIMEGLUMINE 529 MG/ML IV SOLN COMPARISON:  03/03/2017 CT abdomen/ pelvis and abdominal sonogram. FINDINGS: Lower chest: Irregular right lower lobe 2.5 cm pulmonary nodule again identified (series 1105/image 18). Hepatobiliary: Liver is enlarged by numerous confluent heterogeneously enhancing irregular liver masses of varying size, replacing much of the liver parenchyma. Superimposed mild diffuse hepatic steatosis. Representative liver masses as follows: - segment 7 right liver lobe 11.9 x  10.2 cm mass (series 1102/image 30) - lateral segment left liver lobe 9.8 x 8.2 cm mass (series 1102/image 46) - segment 6 right liver lobe 7.1 x 6.8 cm mass (series 1102/image 72) Cholelithiasis. No significant gallbladder distention. No gallbladder wall thickening or pericholecystic fluid. No biliary ductal dilatation. Common bile duct diameter 4 mm. No choledocholithiasis. Pancreas: No pancreatic mass or duct dilation.  No pancreas divisum. Spleen: Top normal size spleen.  No splenic mass. Adrenals/Urinary Tract: No discrete adrenal nodules. No hydronephrosis. Normal kidneys with no renal mass. Stomach/Bowel: Grossly normal stomach. Visualized small and large bowel is normal caliber, with no bowel wall thickening. Vascular/Lymphatic: Atherosclerotic nonaneurysmal abdominal aorta. Patent portal, splenic, hepatic and renal veins. No pathologically enlarged lymph nodes in  the abdomen. Other: No abdominal ascites or focal fluid collection. Musculoskeletal: No aggressive appearing focal osseous lesions. IMPRESSION: 1. Widespread hepatic metastatic disease replacing much of the liver parenchyma. 2. Irregular right lower lobe 2.5 cm pulmonary nodule, cannot exclude primary bronchogenic carcinoma. 3. Cholelithiasis. no evidence of acute cholecystitis. No biliary ductal dilatation. 4. Superimposed mild diffuse hepatic steatosis. Electronically Signed   By: Ilona Sorrel M.D.   On: 03/03/2017 20:36    Scheduled Meds: . docusate sodium  100 mg Oral BID  . heparin  5,000 Units Subcutaneous Q8H  . [START ON 03/06/2017] heparin subcutaneous  5,000 Units Subcutaneous Q8H  . Influenza vac split quadrivalent PF  0.5 mL Intramuscular Tomorrow-1000  . magnesium citrate  1 Bottle Oral Once  . polyethylene glycol  17 g Oral Daily  . sodium chloride flush  3 mL Intravenous Q12H   Continuous Infusions: . sodium chloride 125 mL/hr at 03/04/17 1149  . cefTRIAXone (ROCEPHIN)  IV Stopped (03/03/17 1811)   PRN Meds: acetaminophen **OR** acetaminophen, alum & mag hydroxide-simeth, bisacodyl, HYDROcodone-acetaminophen, morphine injection, ondansetron **OR** ondansetron (ZOFRAN) IV, promethazine  Time spent: 35 minutes  Author: Berle Mull, MD [facility] Pager: [phone] 03/04/2017 3:31 PM  If 7PM-7AM, please contact night-coverage at [email or web address], password [facility]

## 2017-03-04 NOTE — Progress Notes (Signed)
IR aware of request.  Unsure of timing of biopsy given it's the weekend.  Orders in place if can proceed tomorrow, but may not be until Tuesday.  Will defer more accurate timing when team returns tomorrow.  Racquelle Hyser,Daveda E 8:21 AM 03/04/2017

## 2017-03-05 ENCOUNTER — Inpatient Hospital Stay (HOSPITAL_COMMUNITY): Payer: Medicaid Other

## 2017-03-05 LAB — COMPREHENSIVE METABOLIC PANEL
ALT: 49 U/L (ref 14–54)
AST: 154 U/L — ABNORMAL HIGH (ref 15–41)
Albumin: 2.7 g/dL — ABNORMAL LOW (ref 3.5–5.0)
Alkaline Phosphatase: 200 U/L — ABNORMAL HIGH (ref 38–126)
Anion gap: 9 (ref 5–15)
BUN: 6 mg/dL (ref 6–20)
CHLORIDE: 105 mmol/L (ref 101–111)
CO2: 20 mmol/L — ABNORMAL LOW (ref 22–32)
CREATININE: 0.69 mg/dL (ref 0.44–1.00)
Calcium: 7.1 mg/dL — ABNORMAL LOW (ref 8.9–10.3)
Glucose, Bld: 86 mg/dL (ref 65–99)
Potassium: 4.5 mmol/L (ref 3.5–5.1)
Sodium: 134 mmol/L — ABNORMAL LOW (ref 135–145)
Total Bilirubin: 1.2 mg/dL (ref 0.3–1.2)
Total Protein: 6.3 g/dL — ABNORMAL LOW (ref 6.5–8.1)

## 2017-03-05 LAB — CBC WITH DIFFERENTIAL/PLATELET
BASOS ABS: 0 10*3/uL (ref 0.0–0.1)
BASOS PCT: 0 %
EOS ABS: 0.3 10*3/uL (ref 0.0–0.7)
Eosinophils Relative: 2 %
HCT: 30.5 % — ABNORMAL LOW (ref 36.0–46.0)
HEMOGLOBIN: 9.9 g/dL — AB (ref 12.0–15.0)
LYMPHS ABS: 1.8 10*3/uL (ref 0.7–4.0)
Lymphocytes Relative: 11 %
MCH: 25.5 pg — AB (ref 26.0–34.0)
MCHC: 32.5 g/dL (ref 30.0–36.0)
MCV: 78.6 fL (ref 78.0–100.0)
Monocytes Absolute: 2.2 10*3/uL — ABNORMAL HIGH (ref 0.1–1.0)
Monocytes Relative: 14 %
NEUTROS PCT: 73 %
Neutro Abs: 12 10*3/uL — ABNORMAL HIGH (ref 1.7–7.7)
Platelets: 279 10*3/uL (ref 150–400)
RBC: 3.88 MIL/uL (ref 3.87–5.11)
RDW: 16.2 % — ABNORMAL HIGH (ref 11.5–15.5)
WBC: 16.4 10*3/uL — AB (ref 4.0–10.5)

## 2017-03-05 LAB — MAGNESIUM: MAGNESIUM: 2.3 mg/dL (ref 1.7–2.4)

## 2017-03-05 MED ORDER — OXYCODONE-ACETAMINOPHEN 5-325 MG PO TABS
1.0000 | ORAL_TABLET | ORAL | Status: DC | PRN
Start: 2017-03-05 — End: 2017-03-07
  Administered 2017-03-05 – 2017-03-07 (×6): 1 via ORAL
  Filled 2017-03-05 (×6): qty 1

## 2017-03-05 MED ORDER — FENTANYL CITRATE (PF) 100 MCG/2ML IJ SOLN
INTRAMUSCULAR | Status: AC | PRN
  Administered 2017-03-05: 50 ug via INTRAVENOUS

## 2017-03-05 MED ORDER — LIDOCAINE HCL 2 % IJ SOLN
INTRAMUSCULAR | Status: AC
  Filled 2017-03-05: qty 10

## 2017-03-05 MED ORDER — MIDAZOLAM HCL 2 MG/2ML IJ SOLN
INTRAMUSCULAR | Status: AC
  Filled 2017-03-05: qty 2

## 2017-03-05 MED ORDER — FENTANYL CITRATE (PF) 100 MCG/2ML IJ SOLN
INTRAMUSCULAR | Status: AC
  Filled 2017-03-05: qty 2

## 2017-03-05 MED ORDER — MIDAZOLAM HCL 2 MG/2ML IJ SOLN
INTRAMUSCULAR | Status: AC | PRN
  Administered 2017-03-05: 1 mg via INTRAVENOUS

## 2017-03-05 NOTE — Progress Notes (Signed)
[facility] Progress Note  Patient: Marisa Kim TIW:[id number]   PCP: Patient, No Pcp Per DOB: 01-18-1966   DOA: 03/03/2017   DOS: 03/05/2017   Date of Service: the patient was seen and examined on 03/05/2017  Subjective: Still has abdominal pain. Occasional nausea as well. No vomiting. Did have multiple BM yesterday.  Brief hospital course: Pt. with PMH of cervical cancer, pulmonary stenosis S/P open heart surgery at 51 years old age; admitted on 03/03/2017, presented with complaint of abdominal pain, was found to have hepatic mass likely metastasis, right lung mass, cholelithiasis, constipation. Currently further plan is continue further workup of the hepatic mass and treating the pain.  Assessment and Plan: 1. Right upper quadrant pain. Cholelithiasis. Pain appears to be coming from stretched liver capsule versus inflammation after liver capsule itself. Patient presented with complaints of 3 days of right upper quadrant pain with nausea and vomiting poor by mouth intake. Patient has cholelithiasis on the CT scan. Discussed with general surgery recommended no surgical intervention for now as they do not think that patient's pain is coming from cholelithiasis. No evidence of CBD involvement or choledocholithiasis or cholecystitis on ultrasound.  2. SIRS. Possible UTI. Patient with tachycardia, leukocytosis, tachypnea, possible UTI meets sepsis criteria although does not have any fever or chills. I suspect that the leukocytosis is primarily driven by severe dehydration but a possibility of infection cannot be ruled out. I would stop the antibiotics today.  3. Metastatic liver lesions. Right lower lobe spiculated mass 2 cm in size. History of cervical cancer. Patient has not have any colonoscopy or mammogram. She had history of cervical cancer 30 years ago for which she underwent surgery but does not have any follow-up about that as well. Presents with right upper quadrant  pain and ultrasound as well as CT scan is showing multiple metastatic liver lesions. Primary is unclear but possible lung given the presence of a right lung mass. IR consulted for CT-guided biopsy of the liver. MRI liver suggest multiple hepatic lesions and concerning for metastasis. Even after the biopsy patient will need screening mammogram and colonoscopy as an outpatient. Notified oncology navigator to get the patient on schedule.  4. Hyponatremia. Likely from dehydration and poor by mouth intake. Now almost normalized, continue IV hydration.  5. Elevated LFT. Monitor. Avoid liver toxic medications  Diet: Advance to regular diet DVT Prophylaxis: subcutaneous Heparin  Advance goals of care discussion: Full code  Family Communication: family was present at bedside, at the time of interview. The pt provided permission to discuss medical plan with the family. Opportunity was given to ask question and all questions were answered satisfactorily.   Disposition:  Discharge to home.  Consultants: IR Procedures: none  Antibiotics: Anti-infectives    Start     Dose/Rate Route Frequency Ordered Stop   03/03/17 1500  cefTRIAXone (ROCEPHIN) 1 g in dextrose 5 % 50 mL IVPB  Status:  Discontinued     1 g 100 mL/hr over 30 Minutes Intravenous Every 24 hours 03/03/17 1356 03/05/17 1103   03/03/17 1045  piperacillin-tazobactam (ZOSYN) IVPB 3.375 g     3.375 g 100 mL/hr over 30 Minutes Intravenous  Once 03/03/17 1037 03/03/17 1310       Objective: Physical Exam: Vitals:   03/05/17 1635 03/05/17 1706 03/05/17 1729 03/05/17 1748  BP: 106/67 110/87 133/72 137/86  Pulse: 84 85 84 84  Resp: 16 18 18 18   Temp:   98.2 F (36.8 C) 98.2 F (36.8 C)  TempSrc:  Oral Oral  SpO2: 90% 97% 100% 98%  Weight:      Height:        Intake/Output Summary (Last 24 hours) at 03/05/17 1832 Last data filed at 03/05/17 0600  Gross per 24 hour  Intake             3100 ml  Output                0 ml   Net             3100 ml   Filed Weights   03/03/17 0807 03/03/17 1611  Weight: 77.1 kg (170 lb) 81.2 kg (179 lb)   General: Alert, Awake and Oriented to Time, Place and Person. Appear in mild distress, affect appropriate Eyes: PERRL, Conjunctiva normal ENT: Orl Mucosa clear moist Neck: no JVD, no Abnormal Mass Or lumps Cardiovascular: S1 and S2 Present, no Murmur, Peripheral Pulses Present Respiratory: normal respiratory effort, Bilateral Air entry equal and Decreased, no use of accessory muscle, Clear to Auscultation, no Crackles, no wheezes Abdomen: Bowel Sound present, Soft and no tenderness, no hernia Skin: no redness, no Rash, no induration Extremities: no Pedal edema, no calf tenderness Neurologic: Grossly no focal neuro deficit. Bilaterally Equal motor strength  Data Reviewed: CBC:  Recent Labs Lab 03/03/17 0821 03/04/17 0458 03/05/17 0406  WBC 23.9* 16.8* 16.4*  NEUTROABS  --  13.3* 12.0*  HGB 12.1 10.8* 9.9*  HCT 35.4* 33.1* 30.5*  MCV 77.6* 79.8 78.6  PLT 303 274 631   Basic Metabolic Panel:  Recent Labs Lab 03/03/17 0821 03/03/17 1850 03/04/17 0458 03/05/17 0406  NA 124* 131* 132* 134*  K 4.9 4.4 4.3 4.5  CL 88* 99* 100* 105  CO2 20* 21* 22 20*  GLUCOSE 105* 106* 80 86  BUN 18 14 14 6   CREATININE 1.30* 0.99 0.88 0.69  CALCIUM 8.8* 7.8* 7.5* 7.1*  MG  --   --  1.5* 2.3    Liver Function Tests:  Recent Labs Lab 03/03/17 0821 03/03/17 1850 03/04/17 0458 03/05/17 0406  AST 174* 146* 164* 154*  ALT 54 48 48 49  ALKPHOS 227* 181* 187* 200*  BILITOT 2.2* 1.6* 1.7* 1.2  PROT 7.5 6.7 6.6 6.3*  ALBUMIN 3.4* 2.8* 2.8* 2.7*    Recent Labs Lab 03/03/17 0821  LIPASE 23   No results for input(s): AMMONIA in the last 168 hours. Coagulation Profile:  Recent Labs Lab 03/03/17 0821 03/04/17 0458  INR 1.18 1.34   Cardiac Enzymes:  Recent Labs Lab 03/04/17 0458  TROPONINI <0.03   BNP (last 3 results) No results for input(s): PROBNP in  the last 8760 hours. CBG: No results for input(s): GLUCAP in the last 168 hours. Studies: US Biopsy (liver)  Result Date: 03/05/2017 INDICATION: Hepatic metastases, unknown primary EXAM: ULTRASOUND GUIDED CORE BIOPSY OF LEFT HEPATIC MASS MEDICATIONS: 1% LIDOCAINE LOCAL ANESTHESIA/SEDATION: Versed 1.0mg  IV; Fentanyl 14mcg IV; Moderate Sedation Time:  10 MINUTES The patient was continuously monitored during the procedure by the interventional radiology nurse under my direct supervision. FLUOROSCOPY TIME:  Fluoroscopy Time: None. COMPLICATIONS: None immediate. PROCEDURE: The procedure, risks, benefits, and alternatives were explained to the patient. Questions regarding the procedure were encouraged and answered. The patient understands and consents to the procedure. Previous imaging reviewed. Preliminary ultrasound performed. Numerous hepatic lesions demonstrated. A left hepatic mass from a subxiphoid window was marked for access. Under sterile conditions and local anesthesia, a 17 gauge 6.8 cm access needle was advanced from a  subxiphoid approach traversing normal liver tissue to a left hepatic lesion. Needle position confirmed with ultrasound. 3 18 gauge core biopsies obtained. Sample were intact and non fragmented. These were placed in formalin. Needle tract embolized with Gel-Foam. Postprocedure imaging demonstrates no hemorrhage or hematoma. Patient tolerated the biopsy well. FINDINGS: Imaging confirms needle placed in a left hepatic lesion for core biopsy IMPRESSION: Successful ultrasound left hepatic mass 18 gauge core biopsy Electronically Signed   By: Jerilynn Mages.  Shick M.D.   On: 03/05/2017 16:58    Scheduled Meds: . fentaNYL      . Influenza vac split quadrivalent PF  0.5 mL Intramuscular Tomorrow-1000  . lidocaine      . midazolam      . polyethylene glycol  17 g Oral Daily  . sodium chloride flush  3 mL Intravenous Q12H   Continuous Infusions: . sodium chloride 125 mL/hr at 03/05/17 1047   PRN  Meds: acetaminophen **OR** acetaminophen, alum & mag hydroxide-simeth, bisacodyl, morphine injection, ondansetron **OR** ondansetron (ZOFRAN) IV, oxyCODONE-acetaminophen, promethazine  Time spent: 35 minutes  Author: Berle Mull, MD [facility] Pager: [phone] 03/05/2017 6:32 PM  If 7PM-7AM, please contact night-coverage at [email or web address], password [facility]

## 2017-03-05 NOTE — Progress Notes (Signed)
Patient is status post liver biopsy, vital signs monitored, no bleeding or hematoma to right upper quadrant biopsy site, area tender to touch.

## 2017-03-05 NOTE — Consult Note (Signed)
Chief Complaint: Patient was seen in consultation today for image guided liver lesion biopsy Chief Complaint  Patient presents with  . Abdominal Pain  . Nausea  . Emesis     Referring Physician(s): Patel,P  Supervising Physician: Daryll Brod  Patient Status: [facility] - In-pt  History of Present Illness: Marisa Kim is a 51 y.o. female with prior history of cervical cancer, pulmonary stenosis, prior tobacco abuse who was admitted to [facility] on 03/03/17 with right upper quadrant pain, intermittent nausea,constipation and imaging findings of numerous hepatic masses with hepatic steatosis, 2-2.5 cm right lower lobe lung nodule with enlarged pretracheal and right axillary adenopathy, cholelithiasis without cholecystitis. Request now received for image guided liver lesion biopsy for further evaluation.  Past Medical History:  Diagnosis Date  . Cervical ca ([place])   . Pulmonary stenosis     Past Surgical History:  Procedure Laterality Date  . CARDIAC SURGERY    . CERVIX SURGERY     30 years ago    Allergies: Patient has no known allergies.  Medications: Prior to Admission medications   Medication Sig Start Date End Date Taking? Authorizing Provider  naproxen sodium (ANAPROX) 220 MG tablet Take 220 mg by mouth 2 (two) times daily as needed (pain).   Yes [provider]     Family History  Problem Relation Age of Onset  . Lung cancer Mother 25    Social History   Social History  . Marital status: Married    Spouse name: N/A  . Number of children: N/A  . Years of education: N/A   Social History Main Topics  . Smoking status: Former Smoker    Packs/day: 2.00    Years: 30.00    Quit date: 03/03/2010  . Smokeless tobacco: Never Used  . Alcohol use Yes     Comment: occ  . Drug use: No  . Sexual activity: Not Asked   Other Topics Concern  . None   Social History Narrative  . None      Review of Systems currently denies fever, headache, chest pain,  dyspnea, cough, nausea, vomiting bleeding. She continues to have right upper quadrant /lateral abdominal discomfort.  Vital Signs: BP 130/86 (BP Location: Right Arm)   Pulse 81   Temp 98 F (36.7 C) (Oral)   Resp 16   Ht 5' (1.524 m)   Wt 179 lb (81.2 kg)   SpO2 96%   BMI 34.96 kg/m   Physical Exam awake, alert. Chest with distant breath sounds bilaterally. Heart with regular rate and rhythm,+murmur. Abdomen soft, positive bowel sounds, right upper quadrant tenderness to palpation with enlarged liver; extremities with full range of motion.  Imaging: Dg Chest 2 View  Result Date: 03/03/2017 CLINICAL DATA:  51 year old female with a history of leukocytosis. No current chest complaints EXAM: CHEST  2 VIEW COMPARISON:  None. FINDINGS: The heart size and mediastinal contours are within normal limits. Both lungs are clear. Healed right-sided rib fractures. IMPRESSION: No radiographic evidence of acute cardiopulmonary disease Electronically Signed   By: Corrie Mckusick D.O.   On: 03/03/2017 11:01   Ct Chest Wo Contrast  Result Date: 03/03/2017 CLINICAL DATA:  Lung mass. EXAM: CT CHEST WITHOUT CONTRAST TECHNIQUE: Multidetector CT imaging of the chest was performed following the standard protocol without IV contrast. COMPARISON:  CT scan and radiographs of same day. FINDINGS: Cardiovascular: Atherosclerosis of thoracic aorta is noted without aneurysm formation. Enlarged pulmonary artery is noted suggesting pulmonary artery hypertension. Coronary artery calcifications are  noted. No pericardial effusion is noted. Mediastinum/Nodes: Thyroid gland and esophagus appear normal. 1 cm pretracheal lymph node is noted concerning for metastatic disease per 1 cm right axillary lymph node is noted concerning for possible metastatic disease. Lungs/Pleura: No pneumothorax or pleural effusion is noted. Left lung is clear. 1.9 x 1.6 cm spiculated density with pleural tail is noted in the right lower lobe posteriorly  concerning for malignancy. Upper Abdomen: Multiple low densities are noted in hepatic parenchyma concerning for metastatic disease. Musculoskeletal: No chest wall mass or suspicious bone lesions identified. IMPRESSION: 1.9 x 1.6 cm spiculated density seen in right lower lobe is noted concerning for malignancy. PET scan is recommended for further evaluation. Enlarged pretracheal and right axillary adenopathy is noted concerning for possible metastatic disease. Hepatic metastatic lesions are again noted. Enlarged pulmonary artery is noted suggesting pulmonary artery hypertension. Coronary artery calcifications are noted. Aortic Atherosclerosis (ICD10-I70.0). Electronically Signed   By: Marijo Conception, M.D.   On: 03/03/2017 14:23   Ct Abdomen Pelvis W Contrast  Result Date: 03/03/2017 CLINICAL DATA:  Nausea, acute vomiting, non ileus. EXAM: CT ABDOMEN AND PELVIS WITH CONTRAST TECHNIQUE: Multidetector CT imaging of the abdomen and pelvis was performed using the standard protocol following bolus administration of intravenous contrast. CONTRAST:  155mL ISOVUE-300 IOPAMIDOL (ISOVUE-300) INJECTION 61% COMPARISON:  None. FINDINGS: Lower chest: Elongated irregular nodular density aligned along the bronchovascular tree in the right lower lobe with dominant nodular component measuring 21 mm. Hepatobiliary: The liver is enlarged and lobulated. There are numerous areas of low-density, some with a rounded masslike appearance and others more confluent in the subcapsular far right and left lobes. Suspect this is hypoenhancing metastatic disease with superimposed steatosis. Cholelithiasis.No evidence of biliary obstruction or stone. Pancreas: Unremarkable. Spleen: Unremarkable. Adrenals/Urinary Tract: Negative adrenals. No hydronephrosis or stone. Unremarkable bladder. Stomach/Bowel: No primary mass lesion is seen. Mild distal colonic diverticulosis. No appendicitis. Vascular/Lymphatic: No acute vascular abnormality. No mass or  adenopathy. Reproductive:No pathologic findings. Other: Trace pelvic fluid. Musculoskeletal: No acute abnormalities. Smoothly contoured subcutaneous nodule in the right gluteal fat is likely a dermal inclusion cyst. IMPRESSION: 1. Numerous hepatic masses with a metastatic pattern. No clear primary. There is superimposed patchy hepatic steatosis and if biopsy is needed a preoperative MRI could ensure well directed biopsy. 2. Elongated spiculated nodule in the right lower lobe with dominant nodular component measuring 2 cm. This could be a primary or metastatic neoplasm. Full chest CT or PET recommended. 3. Cholelithiasis without cholecystitis. Electronically Signed   By: Monte Fantasia M.D.   On: 03/03/2017 12:30   Mr Liver W KX Contrast  Result Date: 03/03/2017 CLINICAL DATA:  Right upper quadrant abdominal pain. Numerous liver lesions on ultrasound and CT identified suspicious for widespread hepatic metastatic disease. Spiculated right lower lobe pulmonary nodule identified on CT. Remote history of cervical cancer. Former smoker. EXAM: MRI ABDOMEN WITHOUT AND WITH CONTRAST TECHNIQUE: Multiplanar multisequence MR imaging of the abdomen was performed both before and after the administration of intravenous contrast. CONTRAST:  76mL MULTIHANCE GADOBENATE DIMEGLUMINE 529 MG/ML IV SOLN COMPARISON:  03/03/2017 CT abdomen/ pelvis and abdominal sonogram. FINDINGS: Lower chest: Irregular right lower lobe 2.5 cm pulmonary nodule again identified (series 1105/image 18). Hepatobiliary: Liver is enlarged by numerous confluent heterogeneously enhancing irregular liver masses of varying size, replacing much of the liver parenchyma. Superimposed mild diffuse hepatic steatosis. Representative liver masses as follows: - segment 7 right liver lobe 11.9 x 10.2 cm mass (series 1102/image 30) - lateral segment left  liver lobe 9.8 x 8.2 cm mass (series 1102/image 46) - segment 6 right liver lobe 7.1 x 6.8 cm mass (series 1102/image  72) Cholelithiasis. No significant gallbladder distention. No gallbladder wall thickening or pericholecystic fluid. No biliary ductal dilatation. Common bile duct diameter 4 mm. No choledocholithiasis. Pancreas: No pancreatic mass or duct dilation.  No pancreas divisum. Spleen: Top normal size spleen.  No splenic mass. Adrenals/Urinary Tract: No discrete adrenal nodules. No hydronephrosis. Normal kidneys with no renal mass. Stomach/Bowel: Grossly normal stomach. Visualized small and large bowel is normal caliber, with no bowel wall thickening. Vascular/Lymphatic: Atherosclerotic nonaneurysmal abdominal aorta. Patent portal, splenic, hepatic and renal veins. No pathologically enlarged lymph nodes in the abdomen. Other: No abdominal ascites or focal fluid collection. Musculoskeletal: No aggressive appearing focal osseous lesions. IMPRESSION: 1. Widespread hepatic metastatic disease replacing much of the liver parenchyma. 2. Irregular right lower lobe 2.5 cm pulmonary nodule, cannot exclude primary bronchogenic carcinoma. 3. Cholelithiasis. no evidence of acute cholecystitis. No biliary ductal dilatation. 4. Superimposed mild diffuse hepatic steatosis. Electronically Signed   By: Ilona Sorrel M.D.   On: 03/03/2017 20:36   US Abdomen Limited Ruq  Result Date: 03/03/2017 CLINICAL DATA:  Right upper quadrant pain and nausea EXAM: ULTRASOUND ABDOMEN LIMITED RIGHT UPPER QUADRANT COMPARISON:  None. FINDINGS: Gallbladder: Layering gallstone without wall thickening focal tenderness. Common bile duct: Diameter: 4 mm Liver: Diffuse liver heterogeneity and lobulation with the appearance of multiple masses. Patent and antegrade flow in the main portal vein. Portal vein is patent on color Doppler imaging with normal direction of blood flow towards the liver. IMPRESSION: 1. Very heterogeneous liver, likely widespread hepatic metastatic disease. Recommend abdomen and pelvis CT with contrast. 2. Cholelithiasis without  cholecystitis. Electronically Signed   By: Monte Fantasia M.D.   On: 03/03/2017 10:49    Labs:  CBC:  Recent Labs  03/03/17 0821 03/04/17 0458 03/05/17 0406  WBC 23.9* 16.8* 16.4*  HGB 12.1 10.8* 9.9*  HCT 35.4* 33.1* 30.5*  PLT 303 274 279    COAGS:  Recent Labs  03/03/17 0821 03/04/17 0458  INR 1.18 1.34    BMP:  Recent Labs  03/03/17 0821 03/03/17 1850 03/04/17 0458 03/05/17 0406  NA 124* 131* 132* 134*  K 4.9 4.4 4.3 4.5  CL 88* 99* 100* 105  CO2 20* 21* 22 20*  GLUCOSE 105* 106* 80 86  BUN 18 14 14 6   CALCIUM 8.8* 7.8* 7.5* 7.1*  CREATININE 1.30* 0.99 0.88 0.69  GFRNONAA 46* >60 >60 >60  GFRAA 54* >60 >60 >60    LIVER FUNCTION TESTS:  Recent Labs  03/03/17 0821 03/03/17 1850 03/04/17 0458 03/05/17 0406  BILITOT 2.2* 1.6* 1.7* 1.2  AST 174* 146* 164* 154*  ALT 54 48 48 49  ALKPHOS 227* 181* 187* 200*  PROT 7.5 6.7 6.6 6.3*  ALBUMIN 3.4* 2.8* 2.8* 2.7*    TUMOR MARKERS: No results for input(s): AFPTM, CEA, CA199, CHROMGRNA in the last 8760 hours.  Assessment and Plan:  51 y.o. female with prior history of cervical cancer, pulmonary stenosis, prior tobacco abuse who was admitted to [facility] on 03/03/17 with right upper quadrant pain, intermittent nausea and imaging findings of numerous hepatic masses with hepatic steatosis, 2-2.5 cm right lower lobe lung nodule with enlarged pretracheal and right axillary adenopathy, cholelithiasis without cholecystitis. Request now received for image guided liver lesion biopsy for further evaluation. Imaging studies have been reviewed by Dr. Annamaria Boots.Current labs include WBC 16.4, hemoglobin 9.9, platelets 279k, creatinine 0.69,  PT 16.4, INR 1.34, UA with positive nitrite.Risks and benefits discussed with the patient/spouse including, but not limited to bleeding, infection, damage to adjacent structures or low yield requiring additional tests.All of the patient's questions were answered, patient is agreeable to  proceed.Consent signed and in chart.     Thank you for this interesting consult.  I greatly enjoyed meeting Marisa Kim and look forward to participating in their care.  A copy of this report was sent to the requesting provider on this date.  Electronically Signed: D. Rowe Robert, PA-C 03/05/2017, 8:55 AM   I spent a total of 30 minutes in face to face in clinical consultation, greater than 50% of which was counseling/coordinating care for image guided liver lesion biopsy

## 2017-03-05 NOTE — Procedures (Signed)
Liver mets  Status post left hepatic mass ultrasound core biopsy  No immediate complication  EBL 0  Pathology pending  Full report in PACs

## 2017-03-06 ENCOUNTER — Telehealth: Payer: Self-pay | Admitting: Hematology

## 2017-03-06 ENCOUNTER — Inpatient Hospital Stay (HOSPITAL_COMMUNITY): Payer: Medicaid Other

## 2017-03-06 LAB — COMPREHENSIVE METABOLIC PANEL
ALBUMIN: 2.4 g/dL — AB (ref 3.5–5.0)
ALK PHOS: 205 U/L — AB (ref 38–126)
ALT: 44 U/L (ref 14–54)
AST: 118 U/L — ABNORMAL HIGH (ref 15–41)
Anion gap: 7 (ref 5–15)
BILIRUBIN TOTAL: 0.8 mg/dL (ref 0.3–1.2)
BUN: 6 mg/dL (ref 6–20)
CALCIUM: 7 mg/dL — AB (ref 8.9–10.3)
CO2: 21 mmol/L — ABNORMAL LOW (ref 22–32)
Chloride: 104 mmol/L (ref 101–111)
Creatinine, Ser: 0.64 mg/dL (ref 0.44–1.00)
GFR calc Af Amer: >60 mL/min (ref >60)
GLUCOSE: 143 mg/dL — AB (ref 65–99)
Potassium: 3.9 mmol/L (ref 3.5–5.1)
Sodium: 132 mmol/L — ABNORMAL LOW (ref 135–145)
TOTAL PROTEIN: 6 g/dL — AB (ref 6.5–8.1)

## 2017-03-06 LAB — CBC WITH DIFFERENTIAL/PLATELET
BASOS ABS: 0.1 10*3/uL (ref 0.0–0.1)
BASOS PCT: 0 %
EOS PCT: 2 %
Eosinophils Absolute: 0.2 10*3/uL (ref 0.0–0.7)
HEMATOCRIT: 28.4 % — AB (ref 36.0–46.0)
Hemoglobin: 9.4 g/dL — ABNORMAL LOW (ref 12.0–15.0)
Lymphocytes Relative: 12 %
Lymphs Abs: 1.5 10*3/uL (ref 0.7–4.0)
MCH: 26.4 pg (ref 26.0–34.0)
MCHC: 33.1 g/dL (ref 30.0–36.0)
MCV: 79.8 fL (ref 78.0–100.0)
MONO ABS: 1.8 10*3/uL — AB (ref 0.1–1.0)
Monocytes Relative: 14 %
NEUTROS ABS: 9 10*3/uL — AB (ref 1.7–7.7)
Neutrophils Relative %: 72 %
Platelets: 265 10*3/uL (ref 150–400)
RBC: 3.56 MIL/uL — ABNORMAL LOW (ref 3.87–5.11)
RDW: 16.5 % — AB (ref 11.5–15.5)
WBC: 12.5 10*3/uL — ABNORMAL HIGH (ref 4.0–10.5)

## 2017-03-06 MED ORDER — HYDROMORPHONE HCL-NACL 0.5-0.9 MG/ML-% IV SOSY
0.5000 mg | PREFILLED_SYRINGE | INTRAVENOUS | Status: DC | PRN
  Administered 2017-03-08: 0.5 mg via INTRAVENOUS
  Filled 2017-03-06: qty 1

## 2017-03-06 MED ORDER — OXYCODONE-ACETAMINOPHEN 5-325 MG PO TABS: 1.0000 | ORAL_TABLET | ORAL | 0 refills | Status: DC | PRN

## 2017-03-06 MED ORDER — MORPHINE SULFATE ER 30 MG PO TBCR: 30.0000 mg | EXTENDED_RELEASE_TABLET | Freq: Two times a day (BID) | ORAL | 0 refills | Status: DC

## 2017-03-06 MED ORDER — POLYETHYLENE GLYCOL 3350 17 G PO PACK: 17.0000 g | PACK | Freq: Every day | ORAL | 0 refills | Status: DC

## 2017-03-06 MED ORDER — MORPHINE SULFATE ER 30 MG PO TBCR
30.0000 mg | EXTENDED_RELEASE_TABLET | Freq: Two times a day (BID) | ORAL | Status: DC
  Administered 2017-03-06 – 2017-03-08 (×5): 30 mg via ORAL
  Filled 2017-03-06 (×5): qty 1

## 2017-03-06 NOTE — Progress Notes (Signed)
[facility] Progress Note  Patient: Marisa Kim ION:[id number]   PCP: Patient, No Pcp Per DOB: 12/14/1965   DOA: 03/03/2017   DOS: 03/06/2017   Date of Service: the patient was seen and examined on 03/06/2017  Subjective: abdominal pain has worsened since yesterday. No nausea no vomiting. Constipation is also resolved. Tolerating oral diet.  Brief hospital course: Pt. with PMH of cervical cancer, pulmonary stenosis S/P open heart surgery at 51 years old age; admitted on 03/03/2017, presented with complaint of abdominal pain, was found to have hepatic mass likely metastasis, right lung mass, cholelithiasis, constipation. Currently further plan is continue further workup of the hepatic mass and treating the pain.  Assessment and Plan: 1. Right upper quadrant pain. Cholelithiasis. Pain appears to be coming from stretched liver capsule versus inflammation after liver capsule itself. Patient presented with complaints of 3 days of right upper quadrant pain with nausea and vomiting poor by mouth intake. Patient has cholelithiasis on the CT scan. Discussed with general surgery recommended no surgical intervention for now as they do not think that patient's pain is coming from cholelithiasis. No evidence of CBD involvement or choledocholithiasis or cholecystitis on ultrasound.  2. SIRS. Possible UTI.ruled out Patient with tachycardia, leukocytosis, tachypnea, possible UTI meets sepsis criteria although does not have any fever or chills. I suspect that the leukocytosis is primarily driven by severe dehydration but a possibility of infection cannot be ruled out. She was initially given empiric antibiotics, currently stopped. blood cultures negative, urine culture negative.  3. Metastatic liver lesions. Right lower lobe spiculated mass 2 cm in size. History of cervical cancer. Right upper quadrant pain Patient has not have any colonoscopy or mammogram. She had history of cervical cancer  30 years ago for which she underwent surgery but does not have any follow-up about that as well. Presents with right upper quadrant pain and ultrasound as well as CT scan is showing multiple metastatic liver lesions. Primary is unclear but possible lung given the presence of a right lung mass. IR consulted for CT-guided biopsy of the liver. MRI liver suggest multiple hepatic lesions and concerning for metastasis. Even after the biopsy patient will need screening mammogram and colonoscopy as an outpatient. Notified oncology navigator to get the patient on schedule. Patient has significant pain of the right upper quadrant area primarily from liver Involvement. We'll Start Her on Scheduled MS Contin 30 Mg Every 12 Hours and Continue Percocet. Change IV Morphine to Dilaudid.  4. Hyponatremia. Likely from dehydration and poor by mouth intake. Now almost normalized, continue IV hydration.  5. Elevated LFT. Monitor. Avoid liver toxic medications  Diet: Advance to regular diet DVT Prophylaxis: subcutaneous Heparin  Advance goals of care discussion: Full code  Family Communication: family was present at bedside, at the time of interview. The pt provided permission to discuss medical plan with the family. Opportunity was given to ask question and all questions were answered satisfactorily.   Disposition:  Discharge to home.  Consultants: IR Procedures: none  Antibiotics: Anti-infectives    Start     Dose/Rate Route Frequency Ordered Stop   03/03/17 1500  cefTRIAXone (ROCEPHIN) 1 g in dextrose 5 % 50 mL IVPB  Status:  Discontinued     1 g 100 mL/hr over 30 Minutes Intravenous Every 24 hours 03/03/17 1356 03/05/17 1103   03/03/17 1045  piperacillin-tazobactam (ZOSYN) IVPB 3.375 g     3.375 g 100 mL/hr over 30 Minutes Intravenous  Once 03/03/17 1037 03/03/17 1310  Objective: Physical Exam: Vitals:   03/06/17 0527 03/06/17 0930 03/06/17 1315 03/06/17 1404  BP: 123/86 (!) 152/95  (!) 153/91 (!) 150/95  Pulse: 80 80 (!) 103 88  Resp: 17 18 18 20   Temp: 98.3 F (36.8 C) (!) 100.6 F (38.1 C) 98 F (36.7 C) 98.4 F (36.9 C)  TempSrc: Oral Oral Oral Oral  SpO2: 95% 99% 92% 96%  Weight:      Height:        Intake/Output Summary (Last 24 hours) at 03/06/17 1815 Last data filed at 03/06/17 1401  Gross per 24 hour  Intake              600 ml  Output              290 ml  Net              310 ml   Filed Weights   03/03/17 0807 03/03/17 1611  Weight: 77.1 kg (170 lb) 81.2 kg (179 lb)   General: Alert, Awake and Oriented to Time, Place and Person. Appear in mild distress, affect appropriate Eyes: PERRL, Conjunctiva normal ENT: Orl Mucosa clear moist Neck: no JVD, no Abnormal Mass Or lumps Cardiovascular: S1 and S2 Present, no Murmur, Peripheral Pulses Present Respiratory: normal respiratory effort, Bilateral Air entry equal and Decreased, no use of accessory muscle, Clear to Auscultation, no Crackles, no wheezes Abdomen: Bowel Sound present, Soft and severe RUQ tenderness, no hernia Skin: no redness, no Rash, no induration Extremities: no Pedal edema, no calf tenderness Neurologic: Grossly no focal neuro deficit. Bilaterally Equal motor strength  Data Reviewed: CBC:  Recent Labs Lab 03/03/17 0821 03/04/17 0458 03/05/17 0406 03/06/17 0408  WBC 23.9* 16.8* 16.4* 12.5*  NEUTROABS  --  13.3* 12.0* 9.0*  HGB 12.1 10.8* 9.9* 9.4*  HCT 35.4* 33.1* 30.5* 28.4*  MCV 77.6* 79.8 78.6 79.8  PLT 303 274 279 161   Basic Metabolic Panel:  Recent Labs Lab 03/03/17 0821 03/03/17 1850 03/04/17 0458 03/05/17 0406 03/06/17 0408  NA 124* 131* 132* 134* 132*  K 4.9 4.4 4.3 4.5 3.9  CL 88* 99* 100* 105 104  CO2 20* 21* 22 20* 21*  GLUCOSE 105* 106* 80 86 143*  BUN 18 14 14 6 6   CREATININE 1.30* 0.99 0.88 0.69 0.64  CALCIUM 8.8* 7.8* 7.5* 7.1* 7.0*  MG  --   --  1.5* 2.3  --     Liver Function Tests:  Recent Labs Lab 03/03/17 0821 03/03/17 1850  03/04/17 0458 03/05/17 0406 03/06/17 0408  AST 174* 146* 164* 154* 118*  ALT 54 48 48 49 44  ALKPHOS 227* 181* 187* 200* 205*  BILITOT 2.2* 1.6* 1.7* 1.2 0.8  PROT 7.5 6.7 6.6 6.3* 6.0*  ALBUMIN 3.4* 2.8* 2.8* 2.7* 2.4*    Recent Labs Lab 03/03/17 0821  LIPASE 23   No results for input(s): AMMONIA in the last 168 hours. Coagulation Profile:  Recent Labs Lab 03/03/17 0821 03/04/17 0458  INR 1.18 1.34   Cardiac Enzymes:  Recent Labs Lab 03/04/17 0458  TROPONINI <0.03   BNP (last 3 results) No results for input(s): PROBNP in the last 8760 hours. CBG: No results for input(s): GLUCAP in the last 168 hours. Studies: No results found.  Scheduled Meds: . Influenza vac split quadrivalent PF  0.5 mL Intramuscular Tomorrow-1000  . morphine  30 mg Oral Q12H  . polyethylene glycol  17 g Oral Daily  . sodium chloride flush  3 mL  Intravenous Q12H   Continuous Infusions: . sodium chloride 75 mL/hr at 03/06/17 1803   PRN Meds: acetaminophen **OR** acetaminophen, alum & mag hydroxide-simeth, bisacodyl, HYDROmorphone (DILAUDID) injection, ondansetron **OR** ondansetron (ZOFRAN) IV, oxyCODONE-acetaminophen, promethazine  Time spent: 35 minutes  Author: Berle Mull, MD [facility] Pager: [phone] 03/06/2017 6:15 PM  If 7PM-7AM, please contact night-coverage at [email or web address], password [facility]

## 2017-03-06 NOTE — Telephone Encounter (Signed)
Received a call from [name], Nurse Case Manager, to schedule Ms. Marisa Kim for an oncology appt. She says the pt was given 5 days worth of pain meds and doesn't have a pcp and wanted her to be seen sooner than later. Pt has been scheduled with Dr. Irene Limbo on 10/4 at 1pm. I spoke to [facility] [name] for guidance for this pt.

## 2017-03-06 NOTE — Progress Notes (Addendum)
This CM met with pt at bedside for discharge needs. Pt is without insurance or PCP. Pt given packet for [facility] and encouraged to make a follow up appointment at discharge. Pt also given GoodRX coupon for Percocet and MS Contin as this is what MD states pt will DC home on. [name] RN,BSN,NCM [phone]

## 2017-03-07 DIAGNOSIS — K769 Liver disease, unspecified: Secondary | ICD-10-CM

## 2017-03-07 DIAGNOSIS — C787 Secondary malignant neoplasm of liver and intrahepatic bile duct: Secondary | ICD-10-CM

## 2017-03-07 DIAGNOSIS — R1011 Right upper quadrant pain: Secondary | ICD-10-CM

## 2017-03-07 DIAGNOSIS — N179 Acute kidney failure, unspecified: Secondary | ICD-10-CM

## 2017-03-07 DIAGNOSIS — R160 Hepatomegaly, not elsewhere classified: Secondary | ICD-10-CM

## 2017-03-07 DIAGNOSIS — R918 Other nonspecific abnormal finding of lung field: Secondary | ICD-10-CM

## 2017-03-07 DIAGNOSIS — K8020 Calculus of gallbladder without cholecystitis without obstruction: Secondary | ICD-10-CM

## 2017-03-07 DIAGNOSIS — E871 Hypo-osmolality and hyponatremia: Secondary | ICD-10-CM

## 2017-03-07 LAB — COMPREHENSIVE METABOLIC PANEL
ALT: 41 U/L (ref 14–54)
AST: 97 U/L — AB (ref 15–41)
Albumin: 2.7 g/dL — ABNORMAL LOW (ref 3.5–5.0)
Alkaline Phosphatase: 236 U/L — ABNORMAL HIGH (ref 38–126)
Anion gap: 8 (ref 5–15)
BILIRUBIN TOTAL: 1.1 mg/dL (ref 0.3–1.2)
CO2: 21 mmol/L — ABNORMAL LOW (ref 22–32)
CREATININE: 0.6 mg/dL (ref 0.44–1.00)
Calcium: 7.6 mg/dL — ABNORMAL LOW (ref 8.9–10.3)
Chloride: 102 mmol/L (ref 101–111)
GFR calc Af Amer: >60 mL/min (ref >60)
Glucose, Bld: 87 mg/dL (ref 65–99)
Potassium: 4.2 mmol/L (ref 3.5–5.1)
Sodium: 131 mmol/L — ABNORMAL LOW (ref 135–145)
TOTAL PROTEIN: 6.9 g/dL (ref 6.5–8.1)

## 2017-03-07 LAB — CBC WITH DIFFERENTIAL/PLATELET
BASOS ABS: 0 10*3/uL (ref 0.0–0.1)
Basophils Relative: 0 %
Eosinophils Absolute: 0.3 10*3/uL (ref 0.0–0.7)
Eosinophils Relative: 2 %
HEMATOCRIT: 30.8 % — AB (ref 36.0–46.0)
Hemoglobin: 10.1 g/dL — ABNORMAL LOW (ref 12.0–15.0)
LYMPHS PCT: 12 %
Lymphs Abs: 1.9 10*3/uL (ref 0.7–4.0)
MCH: 26.3 pg (ref 26.0–34.0)
MCHC: 32.8 g/dL (ref 30.0–36.0)
MCV: 80.2 fL (ref 78.0–100.0)
Monocytes Absolute: 2.2 10*3/uL — ABNORMAL HIGH (ref 0.1–1.0)
Monocytes Relative: 14 %
NEUTROS ABS: 11.7 10*3/uL — AB (ref 1.7–7.7)
Neutrophils Relative %: 72 %
Platelets: 306 10*3/uL (ref 150–400)
RBC: 3.84 MIL/uL — AB (ref 3.87–5.11)
RDW: 16.6 % — ABNORMAL HIGH (ref 11.5–15.5)
WBC: 16.1 10*3/uL — AB (ref 4.0–10.5)

## 2017-03-07 LAB — MAGNESIUM: MAGNESIUM: 2.2 mg/dL (ref 1.7–2.4)

## 2017-03-07 MED ORDER — OXYCODONE-ACETAMINOPHEN 5-325 MG PO TABS
1.0000 | ORAL_TABLET | ORAL | Status: DC | PRN
  Administered 2017-03-07 – 2017-03-08 (×2): 1 via ORAL
  Filled 2017-03-07 (×2): qty 1

## 2017-03-07 MED ORDER — ONDANSETRON HCL 4 MG/2ML IJ SOLN
4.0000 mg | Freq: Three times a day (TID) | INTRAMUSCULAR | Status: DC
  Administered 2017-03-07 – 2017-03-08 (×3): 4 mg via INTRAVENOUS
  Filled 2017-03-07 (×3): qty 2

## 2017-03-07 MED ORDER — SENNOSIDES-DOCUSATE SODIUM 8.6-50 MG PO TABS
1.0000 | ORAL_TABLET | Freq: Two times a day (BID) | ORAL | Status: DC
  Filled 2017-03-07: qty 1

## 2017-03-07 NOTE — Progress Notes (Signed)
PROGRESS NOTE    Marisa Kim  FMM:[id number] DOB: 1965-06-27 DOA: 03/03/2017 PCP: Patient, No Pcp Per    Brief Narrative:  Pt. with PMH of cervical cancer, pulmonary stenosis S/P open heart surgery at 51 years old age; admitted on 03/03/2017, presented with complaint of abdominal pain, was found to have hepatic mass likely metastasis, right lung mass, cholelithiasis, constipation. Currently further plan is continue further workup of the hepatic mass and treating the pain.   Assessment & Plan:   Principal Problem:   RUQ pain Active Problems:   Hypotension   AKI (acute kidney injury) ([place])   LFT elevation   Leucocytosis   Pyuria   Lactic acid acidosis   Hyponatremia   Liver metastases (HCC)   Cervical cancer (HCC)   Right lower lobe lung mass   Cholelithiasis  #1 metastatic liver lesions/right lower lobes spiculated mass 2 cm in size/history of cervical cancer/right upper quadrant pain Patient on imaging with concerns for metastatic disease in the liver. Patient has not had a colonoscopy or mammogram. Patient with prior history of cervical cancer 30 years ago status post surgery without any recent follow-up. Patient present wider right upper quadrant pain with ultrasound and CT scan concerning for multiple metastatic liver lesions. MRI of the liver suggesting multiple hepatic lesions concerning for metastases. Patient has been seen by interventional radiology in patient status post ultrasound-guided core biopsy of metastatic lesion 03/05/2017. Oncology navigator has been notified and patient presumably set up for outpatient follow-up 03/08/2017. Continue current pain management regimen. Place on scheduled Zofran.  #2 right upper quadrant pain/cholelithiasis Likely secondary to stress liver capsule versus inflammation. Patient presented with 3 days of right upper quadrant pain nausea vomiting, poor oral intake. CT scan with cholelithiasis. Dr. Edsel Petrin discussed with general surgery  recommended at this time no surgical intervention is needed and pain not likely coming from cholelithiasis. No evidence of cyst, bile duct involvement on acute cholecystitis. Continue MS Contin twice daily. Percocet for breakthrough pain.  #3 hyponatremia Improved with hydration.  #4 elevated LFTs Likely secondary to problem #1. Follow.     DVT prophylaxis: SCDs Code Status: Full Family Communication: Updated patient and daughter at bedside. Disposition Plan: Home once nausea and emesis resolved with management of abdominal pain  Consultants:    none  Procedures:   CT chest 03/03/2017  CT abd/pelvis 03/03/2017  DG Abdomen 03/06/2017  CXR 03/03/2017  RUQ ABD [place] 03/03/2017   ultrasound-guided left hepatic mass core biopsy per Dr. Annamaria Boots 03/05/2017  Antimicrobials:  None   Subjective: Patient c/o nausea and emesis with diet today. No CP, No SOB. patient states some improvement with abdominal pain.  Objective: Vitals:   03/06/17 1315 03/06/17 1404 03/06/17 2043 03/07/17 0517  BP: (!) 153/91 (!) 150/95 (!) 153/89 131/85  Pulse: (!) 103 88 87 82  Resp: 18 20 18 20   Temp: 98 F (36.7 C) 98.4 F (36.9 C) 97.8 F (36.6 C) (!) 97.4 F (36.3 C)  TempSrc: Oral Oral Oral Oral  SpO2: 92% 96% 98% 95%  Weight:      Height:        Intake/Output Summary (Last 24 hours) at 03/07/17 1334 Last data filed at 03/07/17 0953  Gross per 24 hour  Intake             1200 ml  Output                0 ml  Net  1200 ml   Filed Weights   03/03/17 0807 03/03/17 1611  Weight: 77.1 kg (170 lb) 81.2 kg (179 lb)    Examination:  General exam: NAD Respiratory system: Clear to auscultation. Respiratory effort normal. Cardiovascular system: S1 & S2 heard, RRR. No JVD, murmurs, rubs, gallops or clicks. No pedal edema. Gastrointestinal system: Abdomen is nondistended, soft and nontender. No organomegaly or masses felt. Normal bowel sounds heard. Central nervous system: Alert  and oriented. No focal neurological deficits. Extremities: Symmetric 5 x 5 power. Skin: No rashes, lesions or ulcers Psychiatry: Judgement and insight appear normal. Mood & affect appropriate.     Data Reviewed: I have personally reviewed following labs and imaging studies  CBC:  Recent Labs Lab 03/03/17 0821 03/04/17 0458 03/05/17 0406 03/06/17 0408 03/07/17 0925  WBC 23.9* 16.8* 16.4* 12.5* 16.1*  NEUTROABS  --  13.3* 12.0* 9.0* 11.7*  HGB 12.1 10.8* 9.9* 9.4* 10.1*  HCT 35.4* 33.1* 30.5* 28.4* 30.8*  MCV 77.6* 79.8 78.6 79.8 80.2  PLT 303 274 279 265 956   Basic Metabolic Panel:  Recent Labs Lab 03/03/17 1850 03/04/17 0458 03/05/17 0406 03/06/17 0408 03/07/17 0925  NA 131* 132* 134* 132* 131*  K 4.4 4.3 4.5 3.9 4.2  CL 99* 100* 105 104 102  CO2 21* 22 20* 21* 21*  GLUCOSE 106* 80 86 143* 87  BUN 14 14 6 6  <5*  CREATININE 0.99 0.88 0.69 0.64 0.60  CALCIUM 7.8* 7.5* 7.1* 7.0* 7.6*  MG  --  1.5* 2.3  --  2.2   GFR: Estimated Creatinine Clearance: 77.7 mL/min (by C-G formula based on SCr of 0.6 mg/dL). Liver Function Tests:  Recent Labs Lab 03/03/17 1850 03/04/17 0458 03/05/17 0406 03/06/17 0408 03/07/17 0925  AST 146* 164* 154* 118* 97*  ALT 48 48 49 44 41  ALKPHOS 181* 187* 200* 205* 236*  BILITOT 1.6* 1.7* 1.2 0.8 1.1  PROT 6.7 6.6 6.3* 6.0* 6.9  ALBUMIN 2.8* 2.8* 2.7* 2.4* 2.7*    Recent Labs Lab 03/03/17 0821  LIPASE 23   No results for input(s): AMMONIA in the last 168 hours. Coagulation Profile:  Recent Labs Lab 03/03/17 0821 03/04/17 0458  INR 1.18 1.34   Cardiac Enzymes:  Recent Labs Lab 03/04/17 0458  TROPONINI <0.03   BNP (last 3 results) No results for input(s): PROBNP in the last 8760 hours. HbA1C: No results for input(s): HGBA1C in the last 72 hours. CBG: No results for input(s): GLUCAP in the last 168 hours. Lipid Profile: No results for input(s): CHOL, HDL, LDLCALC, TRIG, CHOLHDL, LDLDIRECT in the last 72  hours. Thyroid Function Tests: No results for input(s): TSH, T4TOTAL, FREET4, T3FREE, THYROIDAB in the last 72 hours. Anemia Panel: No results for input(s): VITAMINB12, FOLATE, FERRITIN, TIBC, IRON, RETICCTPCT in the last 72 hours. Sepsis Labs:  Recent Labs Lab 03/03/17 0958 03/03/17 1635 03/04/17 0458  LATICACIDVEN 4.55* 2.9* 2.8*    Recent Results (from the past 240 hour(s))  Blood culture (routine x 2)     Status: None (Preliminary result)   Collection Time: 03/03/17  9:45 AM  Result Value Ref Range Status   Specimen Description BLOOD LEFT ANTECUBITAL  Final   Special Requests   Final    BOTTLES DRAWN AEROBIC AND ANAEROBIC Blood Culture adequate volume   Culture   Final    NO GROWTH 3 DAYS Performed at [facility], [address], [place], [place] [address]    Report Status PENDING  Incomplete  Blood culture (routine x 2)     Status: None (Preliminary result)   Collection Time: 03/03/17 10:57 AM  Result Value Ref Range Status   Specimen Description BLOOD LEFT HAND  Final   Special Requests   Final    BOTTLES DRAWN AEROBIC AND ANAEROBIC Blood Culture adequate volume   Culture   Final    NO GROWTH 3 DAYS Performed at [facility], [address], [place], [place] [address]    Report Status PENDING  Incomplete         Radiology Studies: US Biopsy (liver)  Result Date: 03/05/2017 INDICATION: Hepatic metastases, unknown primary EXAM: ULTRASOUND GUIDED CORE BIOPSY OF LEFT HEPATIC MASS MEDICATIONS: 1% LIDOCAINE LOCAL ANESTHESIA/SEDATION: Versed 1.0mg  IV; Fentanyl 79mcg IV; Moderate Sedation Time:  10 MINUTES The patient was continuously monitored during the procedure by the interventional radiology nurse under my direct supervision. FLUOROSCOPY TIME:  Fluoroscopy Time: None. COMPLICATIONS: None immediate. PROCEDURE: The procedure, risks, benefits, and alternatives were explained to the patient. Questions regarding the procedure were encouraged and answered. The  patient understands and consents to the procedure. Previous imaging reviewed. Preliminary ultrasound performed. Numerous hepatic lesions demonstrated. A left hepatic mass from a subxiphoid window was marked for access. Under sterile conditions and local anesthesia, a 17 gauge 6.8 cm access needle was advanced from a subxiphoid approach traversing normal liver tissue to a left hepatic lesion. Needle position confirmed with ultrasound. 3 18 gauge core biopsies obtained. Sample were intact and non fragmented. These were placed in formalin. Needle tract embolized with Gel-Foam. Postprocedure imaging demonstrates no hemorrhage or hematoma. Patient tolerated the biopsy well. FINDINGS: Imaging confirms needle placed in a left hepatic lesion for core biopsy IMPRESSION: Successful ultrasound left hepatic mass 18 gauge core biopsy Electronically Signed   By: Jerilynn Mages.  Shick M.D.   On: 03/05/2017 16:58   Dg Abd Portable 1v  Result Date: 03/06/2017 CLINICAL DATA:  Chronic right-sided abdominal pain found to have hepatic mass likely metastasis, right lung mass on recent CT. EXAM: PORTABLE ABDOMEN - 1 VIEW COMPARISON:  None. FINDINGS: Bowel gas pattern is nonobstructive. No free peritoneal air. Mild degenerate change of the spine and hips. IMPRESSION: Nonobstructive bowel gas pattern. Electronically Signed   By: Marin Olp M.D.   On: 03/06/2017 19:46        Scheduled Meds: . Influenza vac split quadrivalent PF  0.5 mL Intramuscular Tomorrow-1000  . morphine  30 mg Oral Q12H  . ondansetron (ZOFRAN) IV  4 mg Intravenous Q8H  . polyethylene glycol  17 g Oral Daily  . sodium chloride flush  3 mL Intravenous Q12H   Continuous Infusions:   LOS: 4 days    Time spent: 89 mins    Aprel Egelhoff, MD [facility] Pager [phone]  If 7PM-7AM, please contact night-coverage [email or web address] Password [email or web address] 03/07/2017, 1:34 PM

## 2017-03-08 ENCOUNTER — Encounter: Payer: Self-pay | Admitting: Hematology

## 2017-03-08 ENCOUNTER — Ambulatory Visit (HOSPITAL_BASED_OUTPATIENT_CLINIC_OR_DEPARTMENT_OTHER): Payer: Self-pay

## 2017-03-08 ENCOUNTER — Ambulatory Visit (HOSPITAL_BASED_OUTPATIENT_CLINIC_OR_DEPARTMENT_OTHER): Payer: Self-pay | Admitting: Hematology

## 2017-03-08 ENCOUNTER — Telehealth: Payer: Self-pay | Admitting: Hematology

## 2017-03-08 VITALS — BP 139/87 | HR 97 | Temp 98.9°F | Resp 18 | Ht 60.0 in | Wt 192.1 lb

## 2017-03-08 DIAGNOSIS — C801 Malignant (primary) neoplasm, unspecified: Secondary | ICD-10-CM

## 2017-03-08 DIAGNOSIS — C787 Secondary malignant neoplasm of liver and intrahepatic bile duct: Secondary | ICD-10-CM

## 2017-03-08 DIAGNOSIS — G893 Neoplasm related pain (acute) (chronic): Secondary | ICD-10-CM

## 2017-03-08 DIAGNOSIS — Z8679 Personal history of other diseases of the circulatory system: Secondary | ICD-10-CM

## 2017-03-08 DIAGNOSIS — R590 Localized enlarged lymph nodes: Secondary | ICD-10-CM

## 2017-03-08 DIAGNOSIS — Z8541 Personal history of malignant neoplasm of cervix uteri: Secondary | ICD-10-CM

## 2017-03-08 DIAGNOSIS — Z7189 Other specified counseling: Secondary | ICD-10-CM

## 2017-03-08 DIAGNOSIS — R109 Unspecified abdominal pain: Secondary | ICD-10-CM

## 2017-03-08 DIAGNOSIS — Z801 Family history of malignant neoplasm of trachea, bronchus and lung: Secondary | ICD-10-CM

## 2017-03-08 DIAGNOSIS — R945 Abnormal results of liver function studies: Secondary | ICD-10-CM

## 2017-03-08 DIAGNOSIS — C799 Secondary malignant neoplasm of unspecified site: Secondary | ICD-10-CM | POA: Diagnosis present

## 2017-03-08 DIAGNOSIS — Z87891 Personal history of nicotine dependence: Secondary | ICD-10-CM

## 2017-03-08 DIAGNOSIS — R918 Other nonspecific abnormal finding of lung field: Secondary | ICD-10-CM

## 2017-03-08 LAB — CBC WITH DIFFERENTIAL/PLATELET
BASOS ABS: 0 10*3/uL (ref 0.0–0.1)
BASOS PCT: 0 %
Eosinophils Absolute: 0.3 10*3/uL (ref 0.0–0.7)
Eosinophils Relative: 2 %
HEMATOCRIT: 27.5 % — AB (ref 36.0–46.0)
HEMOGLOBIN: 9 g/dL — AB (ref 12.0–15.0)
Lymphocytes Relative: 11 %
Lymphs Abs: 1.5 10*3/uL (ref 0.7–4.0)
MCH: 25.8 pg — ABNORMAL LOW (ref 26.0–34.0)
MCHC: 32.7 g/dL (ref 30.0–36.0)
MCV: 78.8 fL (ref 78.0–100.0)
Monocytes Absolute: 1.9 10*3/uL — ABNORMAL HIGH (ref 0.1–1.0)
Monocytes Relative: 14 %
NEUTROS ABS: 9.9 10*3/uL — AB (ref 1.7–7.7)
NEUTROS PCT: 73 %
Platelets: 306 10*3/uL (ref 150–400)
RBC: 3.49 MIL/uL — ABNORMAL LOW (ref 3.87–5.11)
RDW: 16.5 % — ABNORMAL HIGH (ref 11.5–15.5)
WBC: 13.6 10*3/uL — AB (ref 4.0–10.5)

## 2017-03-08 LAB — COMPREHENSIVE METABOLIC PANEL
ALBUMIN: 2.4 g/dL — AB (ref 3.5–5.0)
ALK PHOS: 201 U/L — AB (ref 38–126)
ALT: 33 U/L (ref 14–54)
ANION GAP: 8 (ref 5–15)
AST: 70 U/L — AB (ref 15–41)
BUN: <5 mg/dL — ABNORMAL LOW (ref 6–20)
CALCIUM: 7.7 mg/dL — AB (ref 8.9–10.3)
CO2: 22 mmol/L (ref 22–32)
Chloride: 102 mmol/L (ref 101–111)
Creatinine, Ser: 0.56 mg/dL (ref 0.44–1.00)
GFR calc Af Amer: >60 mL/min (ref >60)
GFR calc non Af Amer: >60 mL/min (ref >60)
Glucose, Bld: 94 mg/dL (ref 65–99)
POTASSIUM: 4.1 mmol/L (ref 3.5–5.1)
SODIUM: 132 mmol/L — AB (ref 135–145)
TOTAL PROTEIN: 6 g/dL — AB (ref 6.5–8.1)
Total Bilirubin: 1 mg/dL (ref 0.3–1.2)

## 2017-03-08 LAB — CULTURE, BLOOD (ROUTINE X 2)
CULTURE: NO GROWTH
Culture: NO GROWTH
SPECIAL REQUESTS: ADEQUATE
Special Requests: ADEQUATE

## 2017-03-08 MED ORDER — FUROSEMIDE 10 MG/ML IJ SOLN
20.0000 mg | Freq: Once | INTRAMUSCULAR | Status: AC
  Administered 2017-03-08: 20 mg via INTRAVENOUS
  Filled 2017-03-08: qty 2

## 2017-03-08 MED ORDER — POLYETHYLENE GLYCOL 3350 17 G PO PACK: 17.0000 g | PACK | Freq: Every day | ORAL | 0 refills | Status: DC

## 2017-03-08 MED ORDER — MORPHINE SULFATE ER 30 MG PO TBCR: 30.0000 mg | EXTENDED_RELEASE_TABLET | Freq: Two times a day (BID) | ORAL | 0 refills | Status: DC

## 2017-03-08 MED ORDER — DEXAMETHASONE 4 MG PO TABS: 4.0000 mg | ORAL_TABLET | Freq: Every day | ORAL | 0 refills | Status: DC

## 2017-03-08 MED ORDER — SENNOSIDES-DOCUSATE SODIUM 8.6-50 MG PO TABS: 1.0000 | ORAL_TABLET | Freq: Two times a day (BID) | ORAL | Status: DC

## 2017-03-08 MED ORDER — OXYCODONE HCL 5 MG PO TABS: 5.0000 mg | ORAL_TABLET | ORAL | Status: DC | PRN

## 2017-03-08 MED ORDER — OXYCODONE HCL 5 MG PO TABS: 5.0000 mg | ORAL_TABLET | ORAL | 0 refills | Status: DC | PRN

## 2017-03-08 MED ORDER — ONDANSETRON HCL 4 MG PO TABS: 4.0000 mg | ORAL_TABLET | Freq: Three times a day (TID) | ORAL | 0 refills | Status: DC

## 2017-03-08 NOTE — Telephone Encounter (Signed)
Gave avs and calendar for October  °

## 2017-03-08 NOTE — Discharge Summary (Signed)
Physician Discharge Summary  Marisa Kim JKK:[id number] DOB: December 08, 1965 DOA: 03/03/2017  PCP: Patient, No Pcp Per  Admit date: 03/03/2017 Discharge date: 03/08/2017  Time spent: 60 minutes  Recommendations for Outpatient Follow-up:  1. Follow-up with Dr. Irene [facility] today 03/08/2017 for further evaluation and recommendations and management of newly diagnosed metastatic carcinoma. 2. Follow-up with PCP in 1-2 weeks.   Discharge Diagnoses:  Principal Problem:   Metastatic carcinoma ([place]) Active Problems:   Hypotension   AKI (acute kidney injury) ([place])   LFT elevation   Leucocytosis   Pyuria   Lactic acid acidosis   Hyponatremia   Liver metastases ([facility])   Cervical cancer ([facility])   Right lower lobe lung mass   Cholelithiasis   RUQ pain   Liver lesion   Liver mass   Abdominal pain   Discharge Condition: Stable  Diet recommendation: Heart healthy  Filed Weights   03/03/17 0807 03/03/17 1611  Weight: 77.1 kg (170 lb) 81.2 kg (179 lb)    History of present illness:  Per Dr. Earma [place] Kim is a 51 y.o. female with Past medical history of cervical cancer, pulmonary stenosis S/P open heart surgery at 51 year old age. Patient presented with complaints of right upper quadrant pain. Patient mentioned that since last one month she had been having on and off right upper quadrant pain resolving on its own and lasting for a day or 2. Pain was mild and was not bothering her day-to-day activity. 3 days prior to admission, she started having severe pain in the right area along with nausea and the patient had nothing to eat or drink since last 3 days. Patient denied having any complaints of fever or chills. She was unsure whether the pain started after eating heavy fatty meal. She mentioned that she has had chronic constipation and also currently constipated. She mentioned she was passing gas. Denied any blood in the stool denied any cough denied any shortness of breath  denies any chest pain. No swelling of her legs. She never had any colonoscopy, never had mammograms. And does not have any recent follow-up for cervical cancer or Pap smear as well.  ED Course: Presented with right upper quadrant pain, ultrasound was performed which was negative for cholecystitis but showed multiple metastatic lesions. CT abdomen was performed which continued to show multiple metastatic lesions without any choledocholithiasis. Lab work was showing lactic acidosis, acute kidney injury, leukocytosis patient was given IV Zosyn and was referred for admission.  At her baseline ambulates without any support And is independent for most of her ADL; manages her medication on her own.  Hospital Course:  #1 metastatic liver lesions/right lower lobes spiculated mass 2 cm in size/history of cervical cancer/right upper quadrant pain Patient on imaging with concerns for metastatic disease in the liver. Patient has not had a colonoscopy or mammogram. Patient with prior history of cervical cancer 30 years ago status post surgery without any recent follow-up. Patient presented with right upper quadrant pain with ultrasound and CT scan concerning for multiple metastatic liver lesions. MRI of the liver suggested multiple hepatic lesions concerning for metastases. Patient was seen by interventional radiology and patient status post ultrasound-guided core biopsy of metastatic lesion 03/05/2017. Oncology navigator has been notified and patient presumably set up for outpatient follow-up 03/08/2017. Surgical pathology results came back positive for metastatic carcinoma. Patient was maintained on MS Contin twice daily as well as Percocet/oxycodone as needed for breakthrough pain. Patient was also placed on scheduled Zofran with improvement with  her nausea and emesis. Patient be discharged home on scheduled Zofran for the next 4-5 days and then to change to as needed.  Patient will be discharged and is going  directly to the [facility] for further evaluation for newly diagnosed metastatic carcinoma and further management.   #2 right upper quadrant pain/cholelithiasis Likely secondary to stretching liver capsule versus inflammation. Patient presented with 3 days of right upper quadrant pain, nausea, vomiting, poor oral intake. CT scan with cholelithiasis. Dr. Posey Kim discussed with general surgery recommended at this time no surgical intervention was needed and pain not likely coming from cholelithiasis. No evidence of cyst, bile duct involvement on acute cholecystitis. Patient was placed on MS Contin twice daily as well as Percocet which was subsequently changed to oxycodone for breakthrough pain. Outpatient follow-up.  #3 hyponatremia Improved with hydration.  #4 elevated LFTs Likely secondary to problem #1. LFTs trending down. Outpatient follow-up.   Procedures:  CT chest 03/03/2017  CT abd/pelvis 03/03/2017  DG Abdomen 03/06/2017  CXR 03/03/2017  RUQ ABD [place] 03/03/2017   ultrasound-guided left hepatic mass core biopsy per Dr. Annamaria Kim 03/05/2017  Consultations:  None  Discharge Exam: Vitals:   03/07/17 2147 03/08/17 0509  BP: 131/71 (!) 153/96  Pulse: 81 89  Resp: 20 18  Temp: 97.7 F (36.5 C) 97.9 F (36.6 C)  SpO2: 98% 96%    General: NAD Cardiovascular: RRR Respiratory: CTAB  Discharge Instructions   Discharge Instructions    Diet - low sodium heart healthy    Complete by:  As directed    Discharge instructions    Complete by:  As directed    It is important that you read following instructions as well as go over your medication list with RN to help you understand your care after this hospitalization.  Discharge Instructions: Please follow-up with PCP in one week  Please request your primary care physician to go over all Hospital Tests and Procedure/Radiological results at the follow up,  Please get all Hospital records sent to your PCP by signing hospital  release before you go home.   Do not drive, operating heavy machinery, perform activities at heights, swimming or participation in water activities or provide baby sitting services while you are on Pain, Sleep and Anxiety Medications; until you have been seen by Primary Care Physician or a Neurologist and advised to do so again. Do not take more than prescribed Pain, Sleep and Anxiety Medications. You were cared for by a hospitalist during your hospital stay. If you have any questions about your discharge medications or the care you received while you were in the hospital after you are discharged, you can call the unit and ask to speak with the hospitalist on call if the hospitalist that took care of you is not available.  Once you are discharged, your primary care physician will handle any further medical issues. Please note that NO REFILLS for any discharge medications will be authorized once you are discharged, as it is imperative that you return to your primary care physician (or establish a relationship with a primary care physician if you do not have one) for your aftercare needs so that they can reassess your need for medications and monitor your lab values. You Must read complete instructions/literature along with all the possible adverse reactions/side effects for all the Medicines you take and that have been prescribed to you. Take any new Medicines after you have completely understood and accept all the possible adverse reactions/side effects. Wear Seat  belts while driving. If you have smoked or chewed Tobacco in the last 2 yrs please stop smoking and/or stop any Recreational drug use.   Increase activity slowly    Complete by:  As directed      Discharge Medication List as of 03/08/2017 12:39 PM    START taking these medications   Details  ondansetron (ZOFRAN) 4 MG tablet Take 1 tablet (4 mg total) by mouth 3 (three) times daily. Take 1 tablet 3 times daily x 5 days, then every 6 hours  as needed., Starting Thu 03/08/2017, Print    oxyCODONE (OXY IR/ROXICODONE) 5 MG immediate release tablet Take 1-2 tablets (5-10 mg total) by mouth every 4 (four) hours as needed for moderate pain., Starting Thu 03/08/2017, Print    senna-docusate (SENOKOT-S) 8.6-50 MG tablet Take 1 tablet by mouth 2 (two) times daily., Starting Thu 03/08/2017, OTC      CONTINUE these medications which have CHANGED   Details  morphine (MS CONTIN) 30 MG 12 hr tablet Take 1 tablet (30 mg total) by mouth every 12 (twelve) hours., Starting Thu 03/08/2017, Print    polyethylene glycol (MIRALAX / GLYCOLAX) packet Take 17 g by mouth daily., Starting Thu 03/08/2017, Print      CONTINUE these medications which have NOT CHANGED   Details  naproxen sodium (ANAPROX) 220 MG tablet Take 220 mg by mouth 2 (two) times daily as needed (pain)., Historical Med      STOP taking these medications     oxyCODONE-acetaminophen (PERCOCET/ROXICET) 5-325 MG tablet        No Known Allergies Follow-up Information    Christophe Louis, MD Follow up.   Specialty:  Obstetrics and Gynecology Contact information: [address] [place] [address] [place]. Go on 03/08/2017.   Why:  Dr. Irene Kim at 1pm.           The results of significant diagnostics from this hospitalization (including imaging, microbiology, ancillary and laboratory) are listed below for reference.    Significant Diagnostic Studies: Dg Chest 2 View  Result Date: 03/03/2017 CLINICAL DATA:  51 year old female with a history of leukocytosis. No current chest complaints EXAM: CHEST  2 VIEW COMPARISON:  None. FINDINGS: The heart size and mediastinal contours are within normal limits. Both lungs are clear. Healed right-sided rib fractures. IMPRESSION: No radiographic evidence of acute cardiopulmonary disease Electronically Signed   By: Corrie Mckusick D.O.   On: 03/03/2017 11:01   Ct Chest Wo Contrast  Result Date:  03/03/2017 CLINICAL DATA:  Lung mass. EXAM: CT CHEST WITHOUT CONTRAST TECHNIQUE: Multidetector CT imaging of the chest was performed following the standard protocol without IV contrast. COMPARISON:  CT scan and radiographs of same day. FINDINGS: Cardiovascular: Atherosclerosis of thoracic aorta is noted without aneurysm formation. Enlarged pulmonary artery is noted suggesting pulmonary artery hypertension. Coronary artery calcifications are noted. No pericardial effusion is noted. Mediastinum/Nodes: Thyroid gland and esophagus appear normal. 1 cm pretracheal lymph node is noted concerning for metastatic disease per 1 cm right axillary lymph node is noted concerning for possible metastatic disease. Lungs/Pleura: No pneumothorax or pleural effusion is noted. Left lung is clear. 1.9 x 1.6 cm spiculated density with pleural tail is noted in the right lower lobe posteriorly concerning for malignancy. Upper Abdomen: Multiple low densities are noted in hepatic parenchyma concerning for metastatic disease. Musculoskeletal: No chest wall mass or suspicious bone lesions identified. IMPRESSION: 1.9 x 1.6 cm spiculated  density seen in right lower lobe is noted concerning for malignancy. PET scan is recommended for further evaluation. Enlarged pretracheal and right axillary adenopathy is noted concerning for possible metastatic disease. Hepatic metastatic lesions are again noted. Enlarged pulmonary artery is noted suggesting pulmonary artery hypertension. Coronary artery calcifications are noted. Aortic Atherosclerosis (ICD10-I70.0). Electronically Signed   By: Marijo Conception, M.D.   On: 03/03/2017 14:23   Ct Abdomen Pelvis W Contrast  Result Date: 03/03/2017 CLINICAL DATA:  Nausea, acute vomiting, non ileus. EXAM: CT ABDOMEN AND PELVIS WITH CONTRAST TECHNIQUE: Multidetector CT imaging of the abdomen and pelvis was performed using the standard protocol following bolus administration of intravenous contrast. CONTRAST:  179mL  ISOVUE-300 IOPAMIDOL (ISOVUE-300) INJECTION 61% COMPARISON:  None. FINDINGS: Lower chest: Elongated irregular nodular density aligned along the bronchovascular tree in the right lower lobe with dominant nodular component measuring 21 mm. Hepatobiliary: The liver is enlarged and lobulated. There are numerous areas of low-density, some with a rounded masslike appearance and others more confluent in the subcapsular far right and left lobes. Suspect this is hypoenhancing metastatic disease with superimposed steatosis. Cholelithiasis.No evidence of biliary obstruction or stone. Pancreas: Unremarkable. Spleen: Unremarkable. Adrenals/Urinary Tract: Negative adrenals. No hydronephrosis or stone. Unremarkable bladder. Stomach/Bowel: No primary mass lesion is seen. Mild distal colonic diverticulosis. No appendicitis. Vascular/Lymphatic: No acute vascular abnormality. No mass or adenopathy. Reproductive:No pathologic findings. Other: Trace pelvic fluid. Musculoskeletal: No acute abnormalities. Smoothly contoured subcutaneous nodule in the right gluteal fat is likely a dermal inclusion cyst. IMPRESSION: 1. Numerous hepatic masses with a metastatic pattern. No clear primary. There is superimposed patchy hepatic steatosis and if biopsy is needed a preoperative MRI could ensure well directed biopsy. 2. Elongated spiculated nodule in the right lower lobe with dominant nodular component measuring 2 cm. This could be a primary or metastatic neoplasm. Full chest CT or PET recommended. 3. Cholelithiasis without cholecystitis. Electronically Signed   By: Monte Fantasia M.D.   On: 03/03/2017 12:30   Mr Liver W SJ Contrast  Result Date: 03/03/2017 CLINICAL DATA:  Right upper quadrant abdominal pain. Numerous liver lesions on ultrasound and CT identified suspicious for widespread hepatic metastatic disease. Spiculated right lower lobe pulmonary nodule identified on CT. Remote history of cervical cancer. Former smoker. EXAM: MRI  ABDOMEN WITHOUT AND WITH CONTRAST TECHNIQUE: Multiplanar multisequence MR imaging of the abdomen was performed both before and after the administration of intravenous contrast. CONTRAST:  19mL MULTIHANCE GADOBENATE DIMEGLUMINE 529 MG/ML IV SOLN COMPARISON:  03/03/2017 CT abdomen/ pelvis and abdominal sonogram. FINDINGS: Lower chest: Irregular right lower lobe 2.5 cm pulmonary nodule again identified (series 1105/image 18). Hepatobiliary: Liver is enlarged by numerous confluent heterogeneously enhancing irregular liver masses of varying size, replacing much of the liver parenchyma. Superimposed mild diffuse hepatic steatosis. Representative liver masses as follows: - segment 7 right liver lobe 11.9 x 10.2 cm mass (series 1102/image 30) - lateral segment left liver lobe 9.8 x 8.2 cm mass (series 1102/image 46) - segment 6 right liver lobe 7.1 x 6.8 cm mass (series 1102/image 72) Cholelithiasis. No significant gallbladder distention. No gallbladder wall thickening or pericholecystic fluid. No biliary ductal dilatation. Common bile duct diameter 4 mm. No choledocholithiasis. Pancreas: No pancreatic mass or duct dilation.  No pancreas divisum. Spleen: Top normal size spleen.  No splenic mass. Adrenals/Urinary Tract: No discrete adrenal nodules. No hydronephrosis. Normal kidneys with no renal mass. Stomach/Bowel: Grossly normal stomach. Visualized small and large bowel is normal caliber, with no bowel wall thickening. Vascular/Lymphatic:  Atherosclerotic nonaneurysmal abdominal aorta. Patent portal, splenic, hepatic and renal veins. No pathologically enlarged lymph nodes in the abdomen. Other: No abdominal ascites or focal fluid collection. Musculoskeletal: No aggressive appearing focal osseous lesions. IMPRESSION: 1. Widespread hepatic metastatic disease replacing much of the liver parenchyma. 2. Irregular right lower lobe 2.5 cm pulmonary nodule, cannot exclude primary bronchogenic carcinoma. 3. Cholelithiasis. no  evidence of acute cholecystitis. No biliary ductal dilatation. 4. Superimposed mild diffuse hepatic steatosis. Electronically Signed   By: Ilona Sorrel M.D.   On: 03/03/2017 20:36   US Biopsy (liver)  Result Date: 03/05/2017 INDICATION: Hepatic metastases, unknown primary EXAM: ULTRASOUND GUIDED CORE BIOPSY OF LEFT HEPATIC MASS MEDICATIONS: 1% LIDOCAINE LOCAL ANESTHESIA/SEDATION: Versed 1.0mg  IV; Fentanyl 46mcg IV; Moderate Sedation Time:  10 MINUTES The patient was continuously monitored during the procedure by the interventional radiology nurse under my direct supervision. FLUOROSCOPY TIME:  Fluoroscopy Time: None. COMPLICATIONS: None immediate. PROCEDURE: The procedure, risks, benefits, and alternatives were explained to the patient. Questions regarding the procedure were encouraged and answered. The patient understands and consents to the procedure. Previous imaging reviewed. Preliminary ultrasound performed. Numerous hepatic lesions demonstrated. A left hepatic mass from a subxiphoid window was marked for access. Under sterile conditions and local anesthesia, a 17 gauge 6.8 cm access needle was advanced from a subxiphoid approach traversing normal liver tissue to a left hepatic lesion. Needle position confirmed with ultrasound. 3 18 gauge core biopsies obtained. Sample were intact and non fragmented. These were placed in formalin. Needle tract embolized with Gel-Foam. Postprocedure imaging demonstrates no hemorrhage or hematoma. Patient tolerated the biopsy well. FINDINGS: Imaging confirms needle placed in a left hepatic lesion for core biopsy IMPRESSION: Successful ultrasound left hepatic mass 18 gauge core biopsy Electronically Signed   By: Jerilynn Mages.  Shick M.D.   On: 03/05/2017 16:58   Dg Abd Portable 1v  Result Date: 03/06/2017 CLINICAL DATA:  Chronic right-sided abdominal pain found to have hepatic mass likely metastasis, right lung mass on recent CT. EXAM: PORTABLE ABDOMEN - 1 VIEW COMPARISON:  None.  FINDINGS: Bowel gas pattern is nonobstructive. No free peritoneal air. Mild degenerate change of the spine and hips. IMPRESSION: Nonobstructive bowel gas pattern. Electronically Signed   By: Marin Olp M.D.   On: 03/06/2017 19:46   US Abdomen Limited Ruq  Result Date: 03/03/2017 CLINICAL DATA:  Right upper quadrant pain and nausea EXAM: ULTRASOUND ABDOMEN LIMITED RIGHT UPPER QUADRANT COMPARISON:  None. FINDINGS: Gallbladder: Layering gallstone without wall thickening focal tenderness. Common bile duct: Diameter: 4 mm Liver: Diffuse liver heterogeneity and lobulation with the appearance of multiple masses. Patent and antegrade flow in the main portal vein. Portal vein is patent on color Doppler imaging with normal direction of blood flow towards the liver. IMPRESSION: 1. Very heterogeneous liver, likely widespread hepatic metastatic disease. Recommend abdomen and pelvis CT with contrast. 2. Cholelithiasis without cholecystitis. Electronically Signed   By: Monte Fantasia M.D.   On: 03/03/2017 10:49    Microbiology: Recent Results (from the past 240 hour(s))  Blood culture (routine x 2)     Status: None   Collection Time: 03/03/17  9:45 AM  Result Value Ref Range Status   Specimen Description BLOOD LEFT ANTECUBITAL  Final   Special Requests   Final    BOTTLES DRAWN AEROBIC AND ANAEROBIC Blood Culture adequate volume   Culture   Final    NO GROWTH 5 DAYS Performed at [facility], [address], [place], [place] [address]  Report Status 03/08/2017 FINAL  Final  Blood culture (routine x 2)     Status: None   Collection Time: 03/03/17 10:57 AM  Result Value Ref Range Status   Specimen Description BLOOD LEFT HAND  Final   Special Requests   Final    BOTTLES DRAWN AEROBIC AND ANAEROBIC Blood Culture adequate volume   Culture   Final    NO GROWTH 5 DAYS Performed at [facility], [place] [address], [place], [place] [address]    Report Status 03/08/2017 FINAL  Final      Labs: Basic Metabolic Panel:  Recent Labs Lab 03/04/17 0458 03/05/17 0406 03/06/17 0408 03/07/17 0925 03/08/17 0352  NA 132* 134* 132* 131* 132*  K 4.3 4.5 3.9 4.2 4.1  CL 100* 105 104 102 102  CO2 22 20* 21* 21* 22  GLUCOSE 80 86 143* 87 94  BUN 14 6 6  <5* <5*  CREATININE 0.88 0.69 0.64 0.60 0.56  CALCIUM 7.5* 7.1* 7.0* 7.6* 7.7*  MG 1.5* 2.3  --  2.2  --    Liver Function Tests:  Recent Labs Lab 03/04/17 0458 03/05/17 0406 03/06/17 0408 03/07/17 0925 03/08/17 0352  AST 164* 154* 118* 97* 70*  ALT 48 49 44 41 33  ALKPHOS 187* 200* 205* 236* 201*  BILITOT 1.7* 1.2 0.8 1.1 1.0  PROT 6.6 6.3* 6.0* 6.9 6.0*  ALBUMIN 2.8* 2.7* 2.4* 2.7* 2.4*    Recent Labs Lab 03/03/17 0821  LIPASE 23   No results for input(s): AMMONIA in the last 168 hours. CBC:  Recent Labs Lab 03/04/17 0458 03/05/17 0406 03/06/17 0408 03/07/17 0925 03/08/17 0352  WBC 16.8* 16.4* 12.5* 16.1* 13.6*  NEUTROABS 13.3* 12.0* 9.0* 11.7* 9.9*  HGB 10.8* 9.9* 9.4* 10.1* 9.0*  HCT 33.1* 30.5* 28.4* 30.8* 27.5*  MCV 79.8 78.6 79.8 80.2 78.8  PLT 274 279 265 306 306   Cardiac Enzymes:  Recent Labs Lab 03/04/17 0458  TROPONINI <0.03   BNP: BNP (last 3 results) No results for input(s): BNP in the last 8760 hours.  ProBNP (last 3 results) No results for input(s): PROBNP in the last 8760 hours.  CBG: No results for input(s): GLUCAP in the last 168 hours.     SignedIrine Seal MD.  [facility] 03/08/2017, 6:12 PM

## 2017-03-08 NOTE — Progress Notes (Signed)
HEMATOLOGY/ONCOLOGY CONSULTATION NOTE  Date of Service: 03/08/2017  Patient Care Team: Patient, No Pcp Per as PCP - General (General Practice)  CHIEF COMPLAINTS/PURPOSE OF CONSULTATION:  Metastatic cancer with multiple liver lesions with unknown primary   HISTORY OF PRESENTING ILLNESS:   Marisa Kim is a wonderful 51 y.o. 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 She has a h/o cervical cancer, pulmonary stenosis s/p open heart surgery performed at the age of 40. She is not currently followed by a PCP and she has not maintained follow up for her prior h/o cervical cancer. Additionally, she has not had routine colonoscopies or mammograms.   Initially, she presented into the ED on 03/03/17 with a one month history of RUQ abdominal pain which had been intermittent in nature. Each episode resolved without intervention and lasted for 1-2 days typically. Her pain overall was very mild and was not interrupting her day-to-day activities. For three days prior to her admission she developed more severe pain in the RUQ with accompanying nausea and significantly decreased appetite.   In the ED, she had workup including LFTs, [place] RUQ, UA, Lactic Acid. LFTs and alkaline phosphatase were both elevated, and lactic acid was also found to be 4.55. She also was discovered to have leukocytosis at 23K and code sepsis was called. Her RUQ [place] did not show cholecystitis but did show concern for metastasis. She was empirically treated for intra-abdominal infection. UA showed nitrites and bacteria, there was concern for UTI as the cause of her sepsis. Additionally, she had CT A/P which showed concern for metastatic disease within the liver and lung. She was subsequently admitted to medicine.   During her admission she had ultrasound core biopsy performed which showed metastatic carcinoma with unclear primary tumor.   She was  discharged this morning and brought into the clinic for further discussion of her condition. Her hyponatremia was corrected with IVF.   Generally, prior to her diagnosis she has otherwise been healthy. It was approximately 30years ago when her cervical cancer was diagnosed and she had a conization at that time. She has never previously had any other surgeries besides her open heart surgery. She quit smoking 7 years ago, prior to that she had smoked 1-2 packs a day for 30 years. She has not struggled with alcohol abuse and has not previously been exposed to chemicals in her line of work. She does note that her mother did pass from lung cancer later in life.   On review of systems, pt denies fever, chills, rash, mouth sores, weight loss, decreased appetite, night sweats urinary complaints, changes in bowel habits, vaginal discharge, breast pain, nipple discharge or inversion. She denies back pain, neck pain. She reports RUQ abdominal pain, intermittent nausea, decreased appetite, bilateral lower leg swelling.   MEDICAL HISTORY:  Past Medical History:  Diagnosis Date  . Cervical ca ([place])   . Pulmonary stenosis     SURGICAL HISTORY: Past Surgical History:  Procedure Laterality Date  . CARDIAC SURGERY    . CERVIX SURGERY     30 years ago    SOCIAL HISTORY: Social History   Social History  . Marital status: Married    Spouse name: N/A  . Number of children: N/A  . Years of education: N/A   Occupational History  . Not on file.   Social History Main Topics  . Smoking status: Former Smoker  Packs/day: 2.00    Years: 30.00    Quit date: 03/03/2010  . Smokeless tobacco: Never Used  . Alcohol use No  . Drug use: No  . Sexual activity: Not on file   Other Topics Concern  . Not on file   Social History Narrative  . No narrative on file    FAMILY HISTORY: Family History  Problem Relation Age of Onset  . Lung cancer Mother 46    ALLERGIES:  has No Known  Allergies.  MEDICATIONS:  Current Outpatient Prescriptions  Medication Sig Dispense Refill  . morphine (MS CONTIN) 30 MG 12 hr tablet Take 1 tablet (30 mg total) by mouth every 12 (twelve) hours. 10 tablet 0  . naproxen sodium (ANAPROX) 220 MG tablet Take 220 mg by mouth 2 (two) times daily as needed (pain).    . ondansetron (ZOFRAN) 4 MG tablet Take 1 tablet (4 mg total) by mouth 3 (three) times daily. Take 1 tablet 3 times daily x 5 days, then every 6 hours as needed. 30 tablet 0  . oxyCODONE (OXY IR/ROXICODONE) 5 MG immediate release tablet Take 1-2 tablets (5-10 mg total) by mouth every 4 (four) hours as needed for moderate pain. 30 tablet 0  . senna-docusate (SENOKOT-S) 8.6-50 MG tablet Take 1 tablet by mouth 2 (two) times daily.    Marland Kitchen dexamethasone (DECADRON) 4 MG tablet Take 1 tablet (4 mg total) by mouth daily. 10 tablet 0  . polyethylene glycol (MIRALAX / GLYCOLAX) packet Take 17 g by mouth daily. (Patient not taking: Reported on 03/08/2017) 14 each 0   No current facility-administered medications for this visit.     REVIEW OF SYSTEMS:    A 10+ POINT REVIEW OF SYSTEMS WAS OBTAINED including neurology, dermatology, psychiatry, cardiac, respiratory, lymph, extremities, GI, GU, Musculoskeletal, constitutional, breasts, reproductive, HEENT.  All pertinent positives are noted in the HPI.  All others are negative.  PHYSICAL EXAMINATION: ECOG PERFORMANCE STATUS: 2 - Symptomatic, <50% confined to bed  . Vitals:   03/08/17 1344  BP: 139/87  Pulse: 97  Resp: 18  Temp: 98.9 F (37.2 C)  SpO2: 93%   Filed Weights   03/08/17 1344  Weight: 192 lb 1.6 oz (87.1 kg)   .Body mass index is 37.52 kg/m.  GENERAL:alert, in mild emotional distress related to her diagnosis. SKIN: no acute rashes, no significant lesions EYES: conjunctiva are pink and non-injected, sclera anicteric OROPHARYNX: MMM, no exudates, no oropharyngeal erythema or ulceration NECK: supple, no JVD.  LYMPH: Small  1-2cm right axillary lymph node which is palpable. No other palpable LAD.  LUNGS: clear to auscultation b/l with normal respiratory effort HEART: regular rate & rhythm BREAST: No palpable breast masses.  ABDOMEN:  normoactive bowel sounds, non tender. Significant abdominal distension with massive hepatomegaly. Possible mild ascites.  Extremity: 2+ BLE edema PSYCH: alert & oriented x 3 with fluent speech NEURO: no focal motor/sensory deficits  LABORATORY DATA:  I have reviewed the data as listed  . CBC Latest Ref Rng & Units 03/08/2017 03/07/2017 03/06/2017  WBC 4.0 - 10.5 K/uL 13.6(H) 16.1(H) 12.5(H)  Hemoglobin 12.0 - 15.0 g/dL 9.0(L) 10.1(L) 9.4(L)  Hematocrit 36.0 - 46.0 % 27.5(L) 30.8(L) 28.4(L)  Platelets 150 - 400 K/uL 306 306 265    . CMP Latest Ref Rng & Units 03/08/2017 03/07/2017 03/06/2017  Glucose 65 - 99 mg/dL 94 87 143(H)  BUN 6 - 20 mg/dL <5(L) <5(L) 6  Creatinine 0.44 - 1.00 mg/dL 0.56 0.60 0.64  Sodium 135 -  145 mmol/L 132(L) 131(L) 132(L)  Potassium 3.5 - 5.1 mmol/L 4.1 4.2 3.9  Chloride 101 - 111 mmol/L 102 102 104  CO2 22 - 32 mmol/L 22 21(L) 21(L)  Calcium 8.9 - 10.3 mg/dL 7.7(L) 7.6(L) 7.0(L)  Total Protein 6.5 - 8.1 g/dL 6.0(L) 6.9 6.0(L)  Total Bilirubin 0.3 - 1.2 mg/dL 1.0 1.1 0.8  Alkaline Phos 38 - 126 U/L 201(H) 236(H) 205(H)  AST 15 - 41 U/L 70(H) 97(H) 118(H)  ALT 14 - 54 U/L 33 41 44   Component     Latest Ref Rng & Units 03/08/2017  CA 19-9     0 - 35 U/mL 179 (H)  CA 27.29     0.0 - 38.6 U/mL 7,528.8 (H)  CA 15-3     0.0 - 25.0 U/mL 2,161.0 (H)  Cancer Antigen (CA) 125     0.0 - 38.1 U/mL 1,923.0 (H)  CEA ([facility])     0.00 - 5.00 ng/mL 13,146.58 (H)     RADIOGRAPHIC STUDIES: I have personally reviewed the radiological images as listed and agreed with the findings in the report. Dg Chest 2 View  Result Date: 03/03/2017 CLINICAL DATA:  51 year old female with a history of leukocytosis. No current chest complaints EXAM: CHEST  2  VIEW COMPARISON:  None. FINDINGS: The heart size and mediastinal contours are within normal limits. Both lungs are clear. Healed right-sided rib fractures. IMPRESSION: No radiographic evidence of acute cardiopulmonary disease Electronically Signed   By: Corrie Mckusick D.O.   On: 03/03/2017 11:01   Ct Chest Wo Contrast  Result Date: 03/03/2017 CLINICAL DATA:  Lung mass. EXAM: CT CHEST WITHOUT CONTRAST TECHNIQUE: Multidetector CT imaging of the chest was performed following the standard protocol without IV contrast. COMPARISON:  CT scan and radiographs of same day. FINDINGS: Cardiovascular: Atherosclerosis of thoracic aorta is noted without aneurysm formation. Enlarged pulmonary artery is noted suggesting pulmonary artery hypertension. Coronary artery calcifications are noted. No pericardial effusion is noted. Mediastinum/Nodes: Thyroid gland and esophagus appear normal. 1 cm pretracheal lymph node is noted concerning for metastatic disease per 1 cm right axillary lymph node is noted concerning for possible metastatic disease. Lungs/Pleura: No pneumothorax or pleural effusion is noted. Left lung is clear. 1.9 x 1.6 cm spiculated density with pleural tail is noted in the right lower lobe posteriorly concerning for malignancy. Upper Abdomen: Multiple low densities are noted in hepatic parenchyma concerning for metastatic disease. Musculoskeletal: No chest wall mass or suspicious bone lesions identified. IMPRESSION: 1.9 x 1.6 cm spiculated density seen in right lower lobe is noted concerning for malignancy. PET scan is recommended for further evaluation. Enlarged pretracheal and right axillary adenopathy is noted concerning for possible metastatic disease. Hepatic metastatic lesions are again noted. Enlarged pulmonary artery is noted suggesting pulmonary artery hypertension. Coronary artery calcifications are noted. Aortic Atherosclerosis (ICD10-I70.0). Electronically Signed   By: Marijo Conception, M.D.   On:  03/03/2017 14:23   Ct Abdomen Pelvis W Contrast  Result Date: 03/03/2017 CLINICAL DATA:  Nausea, acute vomiting, non ileus. EXAM: CT ABDOMEN AND PELVIS WITH CONTRAST TECHNIQUE: Multidetector CT imaging of the abdomen and pelvis was performed using the standard protocol following bolus administration of intravenous contrast. CONTRAST:  128m ISOVUE-300 IOPAMIDOL (ISOVUE-300) INJECTION 61% COMPARISON:  None. FINDINGS: Lower chest: Elongated irregular nodular density aligned along the bronchovascular tree in the right lower lobe with dominant nodular component measuring 21 mm. Hepatobiliary: The liver is enlarged and lobulated. There are numerous areas of low-density, some  with a rounded masslike appearance and others more confluent in the subcapsular far right and left lobes. Suspect this is hypoenhancing metastatic disease with superimposed steatosis. Cholelithiasis.No evidence of biliary obstruction or stone. Pancreas: Unremarkable. Spleen: Unremarkable. Adrenals/Urinary Tract: Negative adrenals. No hydronephrosis or stone. Unremarkable bladder. Stomach/Bowel: No primary mass lesion is seen. Mild distal colonic diverticulosis. No appendicitis. Vascular/Lymphatic: No acute vascular abnormality. No mass or adenopathy. Reproductive:No pathologic findings. Other: Trace pelvic fluid. Musculoskeletal: No acute abnormalities. Smoothly contoured subcutaneous nodule in the right gluteal fat is likely a dermal inclusion cyst. IMPRESSION: 1. Numerous hepatic masses with a metastatic pattern. No clear primary. There is superimposed patchy hepatic steatosis and if biopsy is needed a preoperative MRI could ensure well directed biopsy. 2. Elongated spiculated nodule in the right lower lobe with dominant nodular component measuring 2 cm. This could be a primary or metastatic neoplasm. Full chest CT or PET recommended. 3. Cholelithiasis without cholecystitis. Electronically Signed   By: Monte Fantasia M.D.   On: 03/03/2017  12:30   Mr Liver W VW Contrast  Result Date: 03/03/2017 CLINICAL DATA:  Right upper quadrant abdominal pain. Numerous liver lesions on ultrasound and CT identified suspicious for widespread hepatic metastatic disease. Spiculated right lower lobe pulmonary nodule identified on CT. Remote history of cervical cancer. Former smoker. EXAM: MRI ABDOMEN WITHOUT AND WITH CONTRAST TECHNIQUE: Multiplanar multisequence MR imaging of the abdomen was performed both before and after the administration of intravenous contrast. CONTRAST:  44m MULTIHANCE GADOBENATE DIMEGLUMINE 529 MG/ML IV SOLN COMPARISON:  03/03/2017 CT abdomen/ pelvis and abdominal sonogram. FINDINGS: Lower chest: Irregular right lower lobe 2.5 cm pulmonary nodule again identified (series 1105/image 18). Hepatobiliary: Liver is enlarged by numerous confluent heterogeneously enhancing irregular liver masses of varying size, replacing much of the liver parenchyma. Superimposed mild diffuse hepatic steatosis. Representative liver masses as follows: - segment 7 right liver lobe 11.9 x 10.2 cm mass (series 1102/image 30) - lateral segment left liver lobe 9.8 x 8.2 cm mass (series 1102/image 46) - segment 6 right liver lobe 7.1 x 6.8 cm mass (series 1102/image 72) Cholelithiasis. No significant gallbladder distention. No gallbladder wall thickening or pericholecystic fluid. No biliary ductal dilatation. Common bile duct diameter 4 mm. No choledocholithiasis. Pancreas: No pancreatic mass or duct dilation.  No pancreas divisum. Spleen: Top normal size spleen.  No splenic mass. Adrenals/Urinary Tract: No discrete adrenal nodules. No hydronephrosis. Normal kidneys with no renal mass. Stomach/Bowel: Grossly normal stomach. Visualized small and large bowel is normal caliber, with no bowel wall thickening. Vascular/Lymphatic: Atherosclerotic nonaneurysmal abdominal aorta. Patent portal, splenic, hepatic and renal veins. No pathologically enlarged lymph nodes in the  abdomen. Other: No abdominal ascites or focal fluid collection. Musculoskeletal: No aggressive appearing focal osseous lesions. IMPRESSION: 1. Widespread hepatic metastatic disease replacing much of the liver parenchyma. 2. Irregular right lower lobe 2.5 cm pulmonary nodule, cannot exclude primary bronchogenic carcinoma. 3. Cholelithiasis. no evidence of acute cholecystitis. No biliary ductal dilatation. 4. Superimposed mild diffuse hepatic steatosis. Electronically Signed   By: JIlona SorrelM.D.   On: 03/03/2017 20:36   [place]  Result Date: 03/05/2017 INDICATION: Hepatic metastases, unknown primary EXAM: ULTRASOUND GUIDED CORE BIOPSY OF LEFT HEPATIC MASS MEDICATIONS: 1% LIDOCAINE LOCAL ANESTHESIA/SEDATION: Versed 1.'0mg'$  IV; Fentanyl 577m IV; Moderate Sedation Time:  10 MINUTES The patient was continuously monitored during the procedure by the interventional radiology nurse under my direct supervision. FLUOROSCOPY TIME:  Fluoroscopy Time: None. COMPLICATIONS: None immediate. PROCEDURE: The procedure, risks, benefits, and alternatives were  explained to the patient. Questions regarding the procedure were encouraged and answered. The patient understands and consents to the procedure. Previous imaging reviewed. Preliminary ultrasound performed. Numerous hepatic lesions demonstrated. A left hepatic mass from a subxiphoid window was marked for access. Under sterile conditions and local anesthesia, a 17 gauge 6.8 cm access needle was advanced from a subxiphoid approach traversing normal liver tissue to a left hepatic lesion. Needle position confirmed with ultrasound. 3 18 gauge core biopsies obtained. Sample were intact and non fragmented. These were placed in formalin. Needle tract embolized with Gel-Foam. Postprocedure imaging demonstrates no hemorrhage or hematoma. Patient tolerated the biopsy well. FINDINGS: Imaging confirms needle placed in a left hepatic lesion for core biopsy IMPRESSION: Successful  ultrasound left hepatic mass 18 gauge core biopsy Electronically Signed   By: Jerilynn Mages.  Shick M.D.   On: 03/05/2017 16:58   Dg Abd Portable 1v  Result Date: 03/06/2017 CLINICAL DATA:  Chronic right-sided abdominal pain found to have hepatic mass likely metastasis, right lung mass on recent CT. EXAM: PORTABLE ABDOMEN - 1 VIEW COMPARISON:  None. FINDINGS: Bowel gas pattern is nonobstructive. No free peritoneal air. Mild degenerate change of the spine and hips. IMPRESSION: Nonobstructive bowel gas pattern. Electronically Signed   By: Marin Olp M.D.   On: 03/06/2017 19:46   US Abdomen Limited Ruq  Result Date: 03/03/2017 CLINICAL DATA:  Right upper quadrant pain and nausea EXAM: ULTRASOUND ABDOMEN LIMITED RIGHT UPPER QUADRANT COMPARISON:  None. FINDINGS: Gallbladder: Layering gallstone without wall thickening focal tenderness. Common bile duct: Diameter: 4 mm Liver: Diffuse liver heterogeneity and lobulation with the appearance of multiple masses. Patent and antegrade flow in the main portal vein. Portal vein is patent on color Doppler imaging with normal direction of blood flow towards the liver. IMPRESSION: 1. Very heterogeneous liver, likely widespread hepatic metastatic disease. Recommend abdomen and pelvis CT with contrast. 2. Cholelithiasis without cholecystitis. Electronically Signed   By: Monte Fantasia M.D.   On: 03/03/2017 10:49    ASSESSMENT & PLAN:   1. Metastatic carcinoma with metastasis to the liver, unknown primary  No other overt focal symptoms suggestive of primary tumor Component     Latest Ref Rng & Units 03/08/2017  CA 19-9     0 - 35 U/mL 179 (H)  CA 27.29     0.0 - 38.6 U/mL 7,528.8 (H)  CA 15-3     0.0 - 25.0 U/mL 2,161.0 (H)  Cancer Antigen (CA) 125     0.0 - 38.1 U/mL 1,923.0 (H)  CEA ([facility])     0.00 - 5.00 ng/mL 13,146.58 (H)   Tumor markers - noted.  Based on IHC and tumor markers -cannot r/o Breast/lung/colonic/Ovarian primary - no very  definitive. Plan -We discussed her biopsy results and her overall condition in great detail today.  -I informed her that we will need to perform more molecular studies (ER/PR/Her2, foundation One) -I recommended a mammogram and PET scan ASAP before we begin treatments so that we can evaluate primary to potentially make more treatment options available. - We will also obtain a mammogram to evaluate for possible breast primary.  -We discussed how we will likely treat this immediately as primary lung cancer for the sake of time and change course as imaging, biopsy, and labs dictate.  -I also informed her that she could consider comfort care as needed, she will think about it and would like to consider her options based on further testing. She also would like to begin  treatment as of right now.  -We will continue looking for her primary tumor at this time. If there is not enough tissue, she agrees to biopsy one of her axillary lymph nodes or other areas as her PET dictates. -We will schedule her for chemo class and carboplatin/Taxol chemotherapy  2. Goal of care discussion  -We discussed the incurable nature of her cancer, and the overall poor prognosis, especially if she does not have good response to chemotherapy or progress on chemo -The patient understands the goal of care is palliative.  Labs today PET/CT in 3-4 days MMG within 3-4 days Chemo-counseling for Carboplatin/Taxol chemotherapy in 7 days.  Carboplatin/Taxol chemotherapy to be schedule in 8-10 days RTC with Dr Irene Limbo with labs in 8-10 days on day of chemotherapy   All of the patients questions were answered with apparent satisfaction. The patient knows to call the clinic with any problems, questions or concerns.  I spent 55 minutes counseling the patient face to face. The total time spent in the appointment was 60 minutes and more than 50% was on counseling and direct patient cares.  Sullivan Lone MD [place] AAHIVMS [facility]  [facility] Hematology/Oncology Physician [facility]  (Office):       [phone] (Work cell):  [phone] (Fax):           [phone]  03/08/2017 2:06 PM  This document serves as a record of services personally performed by Sullivan Lone, MD. It was created on his behalf by Reola Mosher, a trained medical scribe. The creation of this record is based on the scribe's personal observations and the provider's statements to them. This document has been checked and approved by the attending provider.

## 2017-03-09 LAB — CEA (IN HOUSE-CHCC): CEA (CHCC-In House): 13146.58 ng/mL — ABNORMAL HIGH (ref 0.00–5.00)

## 2017-03-09 LAB — CANCER ANTIGEN 27.29: CA 27.29: 7528.8 U/mL — ABNORMAL HIGH (ref 0.0–38.6)

## 2017-03-09 LAB — CANCER ANTIGEN 15-3: CA 15-3: 2161 U/mL — ABNORMAL HIGH (ref 0.0–25.0)

## 2017-03-09 LAB — CA 125

## 2017-03-09 LAB — CANCER ANTIGEN 19-9: CA 19-9: 179 U/mL — ABNORMAL HIGH (ref 0–35)

## 2017-03-12 MED ORDER — OXYCODONE HCL 10 MG PO TABS: 10.0000 mg | ORAL_TABLET | ORAL | 0 refills | Status: AC | PRN

## 2017-03-12 MED ORDER — MORPHINE SULFATE ER 60 MG PO TBCR: 60.0000 mg | EXTENDED_RELEASE_TABLET | Freq: Two times a day (BID) | ORAL | 0 refills | Status: AC

## 2017-03-13 ENCOUNTER — Telehealth: Payer: Self-pay

## 2017-03-13 ENCOUNTER — Other Ambulatory Visit: Payer: Self-pay | Admitting: Hematology

## 2017-03-13 DIAGNOSIS — C799 Secondary malignant neoplasm of unspecified site: Secondary | ICD-10-CM

## 2017-03-13 DIAGNOSIS — C787 Secondary malignant neoplasm of liver and intrahepatic bile duct: Secondary | ICD-10-CM

## 2017-03-13 DIAGNOSIS — Z7189 Other specified counseling: Secondary | ICD-10-CM | POA: Insufficient documentation

## 2017-03-13 MED ORDER — ONDANSETRON HCL 8 MG PO TABS: 8.0000 mg | ORAL_TABLET | Freq: Two times a day (BID) | ORAL | 1 refills | Status: DC | PRN

## 2017-03-13 MED ORDER — LORAZEPAM 0.5 MG PO TABS: 0.5000 mg | ORAL_TABLET | Freq: Four times a day (QID) | ORAL | 0 refills | Status: DC | PRN

## 2017-03-13 MED ORDER — PROCHLORPERAZINE MALEATE 10 MG PO TABS: 10.0000 mg | ORAL_TABLET | Freq: Four times a day (QID) | ORAL | 1 refills | Status: DC | PRN

## 2017-03-13 MED ORDER — DEXAMETHASONE 4 MG PO TABS: 8.0000 mg | ORAL_TABLET | Freq: Every day | ORAL | 1 refills | Status: DC

## 2017-03-13 NOTE — Telephone Encounter (Signed)
Unable to reach pt. Left VM on home phone to let pt know that she needs to p/u her prescriptions prior to treatment start on 03/19/17. Zofran, compazine, and decadron available at [facility] in [place]. Ativan, morphine, and oxycodone for p/u at [facility] to be taken to pharmacy to be filled. Pt given call back number to contact with any issues [phone].

## 2017-03-13 NOTE — Progress Notes (Signed)
START OFF PATHWAY REGIMEN - [Other Dx]   [id number]:Carboplatin AUC=6 + Paclitaxel 175 mg/m2 q21 Days:   A cycle is every 21 days:     Paclitaxel      Carboplatin   **Always confirm dose/schedule in your pharmacy ordering system**  Patient Characteristics: Intent of Therapy: Non-Curative / Palliative Intent, Discussed with Patient 

## 2017-03-14 ENCOUNTER — Encounter: Payer: Self-pay | Admitting: *Deleted

## 2017-03-15 ENCOUNTER — Encounter: Payer: Self-pay | Admitting: *Deleted

## 2017-03-15 ENCOUNTER — Other Ambulatory Visit: Payer: Self-pay

## 2017-03-15 NOTE — Progress Notes (Signed)
HEMATOLOGY/ONCOLOGY CONSULTATION NOTE  Date of Service: 03/19/2017  Patient Care Team: Patient, No Pcp Per as PCP - General (General Practice)  CHIEF COMPLAINTS/PURPOSE OF CONSULTATION:  Metastatic cancer with multiple liver lesions with unknown primary   HISTORY OF PRESENTING ILLNESS:   Marisa Kim is a wonderful 51 y.o. 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 She has a h/o cervical cancer, pulmonary stenosis s/p open heart surgery performed at the age of 16. She is not currently followed by a PCP and she has not maintained follow up for her prior h/o cervical cancer. Additionally, she has not had routine colonoscopies or mammograms.   Initially, she presented into the ED on 03/03/17 with a one month history of RUQ abdominal pain which had been intermittent in nature. Each episode resolved without intervention and lasted for 1-2 days typically. Her pain overall was very mild and was not interrupting her day-to-day activities. For three days prior to her admission she developed more severe pain in the RUQ with accompanying nausea and significantly decreased appetite.   In the ED, she had workup including LFTs, [place] RUQ, UA, Lactic Acid. LFTs and alkaline phosphatase were both elevated, and lactic acid was also found to be 4.55. She also was discovered to have leukocytosis at 23K and code sepsis was called. Her RUQ [place] did not show cholecystitis but did show concern for metastasis. She was empirically treated for intra-abdominal infection. UA showed nitrites and bacteria, there was concern for UTI as the cause of her sepsis. Additionally, she had CT A/P which showed concern for metastatic disease within the liver and lung. She was subsequently admitted to medicine.   During her admission she had ultrasound core biopsy performed which showed metastatic carcinoma with unclear primary tumor.   She was  discharged this morning and brought into the clinic for further discussion of her condition. Her hyponatremia was corrected with IVF.   Generally, prior to her diagnosis she has otherwise been healthy. It was approximately 30years ago when her cervical cancer was diagnosed and she had a conization at that time. She has never previously had any other surgeries besides her open heart surgery. She quit smoking 7 years ago, prior to that she had smoked 1-2 packs a day for 30 years. She has not struggled with alcohol abuse and has not previously been exposed to chemicals in her line of work. She does note that her mother did pass from lung cancer later in life.   On review of systems, pt denies fever, chills, rash, mouth sores, weight loss, decreased appetite, night sweats urinary complaints, changes in bowel habits, vaginal discharge, breast pain, nipple discharge or inversion. She denies back pain, neck pain. She reports RUQ abdominal pain, intermittent nausea, decreased appetite, bilateral lower leg swelling.    CURRENT THERAPY: Carboplatin/Taxol starting 03/19/17  INTERVAL HISTORY:   Marisa Kim is here for a follow up and first cycle Carboplatin and Taxol.  She notes having nausea and her pain is controlled with oxycodone prn and MS contin '60mg'$  po q12h.The Zofran is helping very little. Her nausea is constant, she is not able to keep food or water down. She is taking Dexamethasone. She denies heartburn. She has been trying to drink boost and her BMs are every other day with normal stool. She has never had a colonoscopy before.  She has scheduled [place], mammogram and PET this month. Her scans  will attempt to determine the primary tumor.. She has not had a PCP and will try to find one [place]ally where she lives.. They are having financial issues and she is needing her FMLA paperwork filled out along with any possible assistance. She completed chemo class and has no further questions. She does not want a  port placed unless she needs it. She is overall ready to start chemotherapy treatment today.     MEDICAL HISTORY:  Past Medical History:  Diagnosis Date  . Cervical ca ([place])   . Pulmonary stenosis     SURGICAL HISTORY: Past Surgical History:  Procedure Laterality Date  . CARDIAC SURGERY    . CERVIX SURGERY     30 years ago    SOCIAL HISTORY: Social History   Social History  . Marital status: Married    Spouse name: N/A  . Number of children: N/A  . Years of education: N/A   Occupational History  . Not on file.   Social History Main Topics  . Smoking status: Former Smoker    Packs/day: 2.00    Years: 30.00    Quit date: 03/03/2010  . Smokeless tobacco: Never Used  . Alcohol use No  . Drug use: No  . Sexual activity: Not on file   Other Topics Concern  . Not on file   Social History Narrative  . No narrative on file    FAMILY HISTORY: Family History  Problem Relation Age of Onset  . Lung cancer Mother 70    ALLERGIES:  has No Known Allergies.  MEDICATIONS:  Current Outpatient Prescriptions  Medication Sig Dispense Refill  . dexamethasone (DECADRON) 4 MG tablet Take 1 tablet (4 mg total) by mouth daily. 10 tablet 0  . dexamethasone (DECADRON) 4 MG tablet Take 2 tablets (8 mg total) by mouth daily. Start the day after chemotherapy for 2 days. 30 tablet 1  . LORazepam (ATIVAN) 0.5 MG tablet Take 1 tablet (0.5 mg total) by mouth every 6 (six) hours as needed (Nausea or vomiting). 30 tablet 0  . morphine (MS CONTIN) 60 MG 12 hr tablet Take 1 tablet (60 mg total) by mouth every 12 (twelve) hours. 60 tablet 0  . naproxen sodium (ANAPROX) 220 MG tablet Take 220 mg by mouth 2 (two) times daily as needed (pain).    . ondansetron (ZOFRAN) 4 MG tablet Take 1 tablet (4 mg total) by mouth 3 (three) times daily. Take 1 tablet 3 times daily x 5 days, then every 6 hours as needed. 30 tablet 0  . ondansetron (ZOFRAN) 8 MG tablet Take 1 tablet (8 mg total) by mouth 2 (two)  times daily as needed for refractory nausea / vomiting. Start on day 3 after chemo. 30 tablet 1  . oxyCODONE 10 MG TABS Take 1 tablet (10 mg total) by mouth every 4 (four) hours as needed for moderate pain. 60 tablet 0  . polyethylene glycol (MIRALAX / GLYCOLAX) packet Take 17 g by mouth daily. 14 each 0  . prochlorperazine (COMPAZINE) 10 MG tablet Take 1 tablet (10 mg total) by mouth every 6 (six) hours as needed (Nausea or vomiting). 30 tablet 1  . senna-docusate (SENOKOT-S) 8.6-50 MG tablet Take 1 tablet by mouth 2 (two) times daily.     No current facility-administered medications for this visit.     REVIEW OF SYSTEMS:    A 10+ POINT REVIEW OF SYSTEMS WAS OBTAINED including neurology, dermatology, psychiatry, cardiac, respiratory, lymph, extremities, GI, GU, Musculoskeletal, constitutional, breasts,  reproductive, HEENT.  All pertinent positives are noted in the HPI.  All others are negative.  PHYSICAL EXAMINATION: ECOG PERFORMANCE STATUS: 2 - Symptomatic, <50% confined to bed   Vitals:   03/19/17 1113  BP: (!) 154/96  Pulse: 76  Resp: 18  Temp: 97.7 F (36.5 C)  SpO2: 94%   Filed Weights   03/19/17 1113  Weight: 180 lb 9.6 oz (81.9 kg)   .Body mass index is 35.27 kg/m.  GENERAL:alert, in mild emotional distress related to her diagnosis. SKIN: no acute rashes, no significant lesions EYES: conjunctiva are pink and non-injected, sclera anicteric OROPHARYNX: MMM, no exudates, no oropharyngeal erythema or ulceration NECK: supple, no JVD.  LYMPH: Small 1-2cm right axillary lymph node which is palpable. No other palpable LAD.  LUNGS: clear to auscultation b/l with normal respiratory effort HEART: regular rate & rhythm BREAST: No palpable breast masses.  ABDOMEN:  normoactive bowel sounds, non tender. Significant abdominal distension with massive hepatomegaly. Possible mild ascites.  Extremity: 2+ BLE edema PSYCH: alert & oriented x 3 with fluent speech NEURO: no focal  motor/sensory deficits  LABORATORY DATA:  I have reviewed the data as listed  . CBC Latest Ref Rng & Units 03/19/2017 03/08/2017 03/07/2017  WBC 3.9 - 10.3 10e3/uL 21.5(H) 13.6(H) 16.1(H)  Hemoglobin 11.6 - 15.9 g/dL 10.4(L) 9.0(L) 10.1(L)  Hematocrit 34.8 - 46.6 % 32.4(L) 27.5(L) 30.8(L)  Platelets 145 - 400 10e3/uL 450(H) 306 306    . CMP Latest Ref Rng & Units 03/19/2017 03/08/2017 03/07/2017  Glucose 70 - 140 mg/dl 82 94 87  BUN 7.0 - 26.0 mg/dL 12.6 <5(L) <5(L)  Creatinine 0.6 - 1.1 mg/dL 0.8 0.56 0.60  Sodium 136 - 145 mEq/L 132(L) 132(L) 131(L)  Potassium 3.5 - 5.1 mEq/L 4.7 4.1 4.2  Chloride 101 - 111 mmol/L - 102 102  CO2 22 - 29 mEq/L 24 22 21(L)  Calcium 8.4 - 10.4 mg/dL 8.9 7.7(L) 7.6(L)  Total Protein 6.4 - 8.3 g/dL 7.3 6.0(L) 6.9  Total Bilirubin 0.20 - 1.20 mg/dL 1.79(H) 1.0 1.1  Alkaline Phos 40 - 150 U/L 240(H) 201(H) 236(H)  AST 5 - 34 U/L 140(H) 70(H) 97(H)  ALT 0 - 55 U/L 35 33 41   Component     Latest Ref Rng & Units 03/08/2017  CA 19-9     0 - 35 U/mL 179 (H)  CA 27.29     0.0 - 38.6 U/mL 7,528.8 (H)  CA 15-3     0.0 - 25.0 U/mL 2,161.0 (H)  Cancer Antigen (CA) 125     0.0 - 38.1 U/mL 1,923.0 (H)  CEA ([facility])     0.00 - 5.00 ng/mL 13,146.58 (H)     RADIOGRAPHIC STUDIES: I have personally reviewed the radiological images as listed and agreed with the findings in the report. Dg Chest 2 View  Result Date: 03/03/2017 CLINICAL DATA:  51 year old female with a history of leukocytosis. No current chest complaints EXAM: CHEST  2 VIEW COMPARISON:  None. FINDINGS: The heart size and mediastinal contours are within normal limits. Both lungs are clear. Healed right-sided rib fractures. IMPRESSION: No radiographic evidence of acute cardiopulmonary disease Electronically Signed   By: Corrie Mckusick D.O.   On: 03/03/2017 11:01   Ct Chest Wo Contrast  Result Date: 03/03/2017 CLINICAL DATA:  Lung mass. EXAM: CT CHEST WITHOUT CONTRAST TECHNIQUE:  Multidetector CT imaging of the chest was performed following the standard protocol without IV contrast. COMPARISON:  CT scan and radiographs of same  day. FINDINGS: Cardiovascular: Atherosclerosis of thoracic aorta is noted without aneurysm formation. Enlarged pulmonary artery is noted suggesting pulmonary artery hypertension. Coronary artery calcifications are noted. No pericardial effusion is noted. Mediastinum/Nodes: Thyroid gland and esophagus appear normal. 1 cm pretracheal lymph node is noted concerning for metastatic disease per 1 cm right axillary lymph node is noted concerning for possible metastatic disease. Lungs/Pleura: No pneumothorax or pleural effusion is noted. Left lung is clear. 1.9 x 1.6 cm spiculated density with pleural tail is noted in the right lower lobe posteriorly concerning for malignancy. Upper Abdomen: Multiple low densities are noted in hepatic parenchyma concerning for metastatic disease. Musculoskeletal: No chest wall mass or suspicious bone lesions identified. IMPRESSION: 1.9 x 1.6 cm spiculated density seen in right lower lobe is noted concerning for malignancy. PET scan is recommended for further evaluation. Enlarged pretracheal and right axillary adenopathy is noted concerning for possible metastatic disease. Hepatic metastatic lesions are again noted. Enlarged pulmonary artery is noted suggesting pulmonary artery hypertension. Coronary artery calcifications are noted. Aortic Atherosclerosis (ICD10-I70.0). Electronically Signed   By: Lupita Raider, M.D.   On: 03/03/2017 14:23   Ct Abdomen Pelvis W Contrast  Result Date: 03/03/2017 CLINICAL DATA:  Nausea, acute vomiting, non ileus. EXAM: CT ABDOMEN AND PELVIS WITH CONTRAST TECHNIQUE: Multidetector CT imaging of the abdomen and pelvis was performed using the standard protocol following bolus administration of intravenous contrast. CONTRAST:  ISOVUE-300 IOPAMIDOL (ISOVUE-300) INJECTION 61% COMPARISON:  None. FINDINGS:  Lower chest: Elongated irregular nodular density aligned along the bronchovascular tree in the right lower lobe with dominant nodular component measuring 21 mm. Hepatobiliary: The liver is enlarged and lobulated. There are numerous areas of low-density, some with a rounded masslike appearance and others more confluent in the subcapsular far right and left lobes. Suspect this is hypoenhancing metastatic disease with superimposed steatosis. Cholelithiasis.No evidence of biliary obstruction or stone. Pancreas: Unremarkable. Spleen: Unremarkable. Adrenals/Urinary Tract: Negative adrenals. No hydronephrosis or stone. Unremarkable bladder. Stomach/Bowel: No primary mass lesion is seen. Mild distal colonic diverticulosis. No appendicitis. Vascular/Lymphatic: No acute vascular abnormality. No mass or adenopathy. Reproductive:No pathologic findings. Other: Trace pelvic fluid. Musculoskeletal: No acute abnormalities. Smoothly contoured subcutaneous nodule in the right gluteal fat is likely a dermal inclusion cyst. IMPRESSION: 1. Numerous hepatic masses with a metastatic pattern. No clear primary. There is superimposed patchy hepatic steatosis and if biopsy is needed a preoperative MRI could ensure well directed biopsy. 2. Elongated spiculated nodule in the right lower lobe with dominant nodular component measuring 2 cm. This could be a primary or metastatic neoplasm. Full chest CT or PET recommended. 3. Cholelithiasis without cholecystitis. Electronically Signed   By: Marnee Spring M.D.   On: 03/03/2017 12:30   Mr Liver W RI Contrast  Result Date: 03/03/2017 CLINICAL DATA:  Right upper quadrant abdominal pain. Numerous liver lesions on ultrasound and CT identified suspicious for widespread hepatic metastatic disease. Spiculated right lower lobe pulmonary nodule identified on CT. Remote history of cervical cancer. Former smoker. EXAM: MRI ABDOMEN WITHOUT AND WITH CONTRAST TECHNIQUE: Multiplanar multisequence MR imaging  of the abdomen was performed both before and after the administration of intravenous contrast. CONTRAST:  69mL MULTIHANCE GADOBENATE DIMEGLUMINE 529 MG/ML IV SOLN COMPARISON:  03/03/2017 CT abdomen/ pelvis and abdominal sonogram. FINDINGS: Lower chest: Irregular right lower lobe 2.5 cm pulmonary nodule again identified (series 1105/image 18). Hepatobiliary: Liver is enlarged by numerous confluent heterogeneously enhancing irregular liver masses of varying size, replacing much of the liver parenchyma. Superimposed mild diffuse  hepatic steatosis. Representative liver masses as follows: - segment 7 right liver lobe 11.9 x 10.2 cm mass (series 1102/image 30) - lateral segment left liver lobe 9.8 x 8.2 cm mass (series 1102/image 46) - segment 6 right liver lobe 7.1 x 6.8 cm mass (series 1102/image 72) Cholelithiasis. No significant gallbladder distention. No gallbladder wall thickening or pericholecystic fluid. No biliary ductal dilatation. Common bile duct diameter 4 mm. No choledocholithiasis. Pancreas: No pancreatic mass or duct dilation.  No pancreas divisum. Spleen: Top normal size spleen.  No splenic mass. Adrenals/Urinary Tract: No discrete adrenal nodules. No hydronephrosis. Normal kidneys with no renal mass. Stomach/Bowel: Grossly normal stomach. Visualized small and large bowel is normal caliber, with no bowel wall thickening. Vascular/Lymphatic: Atherosclerotic nonaneurysmal abdominal aorta. Patent portal, splenic, hepatic and renal veins. No pathologically enlarged lymph nodes in the abdomen. Other: No abdominal ascites or focal fluid collection. Musculoskeletal: No aggressive appearing focal osseous lesions. IMPRESSION: 1. Widespread hepatic metastatic disease replacing much of the liver parenchyma. 2. Irregular right lower lobe 2.5 cm pulmonary nodule, cannot exclude primary bronchogenic carcinoma. 3. Cholelithiasis. no evidence of acute cholecystitis. No biliary ductal dilatation. 4. Superimposed mild  diffuse hepatic steatosis. Electronically Signed   By: Ilona Sorrel M.D.   On: 03/03/2017 20:36   US Biopsy (liver)  Result Date: 03/05/2017 INDICATION: Hepatic metastases, unknown primary EXAM: ULTRASOUND GUIDED CORE BIOPSY OF LEFT HEPATIC MASS MEDICATIONS: 1% LIDOCAINE LOCAL ANESTHESIA/SEDATION: Versed 1.'0mg'$  IV; Fentanyl 13mg IV; Moderate Sedation Time:  10 MINUTES The patient was continuously monitored during the procedure by the interventional radiology nurse under my direct supervision. FLUOROSCOPY TIME:  Fluoroscopy Time: None. COMPLICATIONS: None immediate. PROCEDURE: The procedure, risks, benefits, and alternatives were explained to the patient. Questions regarding the procedure were encouraged and answered. The patient understands and consents to the procedure. Previous imaging reviewed. Preliminary ultrasound performed. Numerous hepatic lesions demonstrated. A left hepatic mass from a subxiphoid window was marked for access. Under sterile conditions and local anesthesia, a 17 gauge 6.8 cm access needle was advanced from a subxiphoid approach traversing normal liver tissue to a left hepatic lesion. Needle position confirmed with ultrasound. 3 18 gauge core biopsies obtained. Sample were intact and non fragmented. These were placed in formalin. Needle tract embolized with Gel-Foam. Postprocedure imaging demonstrates no hemorrhage or hematoma. Patient tolerated the biopsy well. FINDINGS: Imaging confirms needle placed in a left hepatic lesion for core biopsy IMPRESSION: Successful ultrasound left hepatic mass 18 gauge core biopsy Electronically Signed   By: MJerilynn Mages  Shick M.D.   On: 03/05/2017 16:58   Dg Abd Portable 1v  Result Date: 03/06/2017 CLINICAL DATA:  Chronic right-sided abdominal pain found to have hepatic mass likely metastasis, right lung mass on recent CT. EXAM: PORTABLE ABDOMEN - 1 VIEW COMPARISON:  None. FINDINGS: Bowel gas pattern is nonobstructive. No free peritoneal air. Mild degenerate  change of the spine and hips. IMPRESSION: Nonobstructive bowel gas pattern. Electronically Signed   By: DMarin OlpM.D.   On: 03/06/2017 19:46   [place]dom[place] Limited Ruq  Result Date: 03/03/2017 CLINICAL DATA:  Right upper quadrant pain and nausea EXAM: ULTRASOUND ABDOMEN LIMITED RIGHT UPPER QUADRANT COMPARISON:  None. FINDINGS: Gallbladder: Layering gallstone without wall thickening focal tenderness. Common bile duct: Diameter: 4 mm Liver: Diffuse liver heterogeneity and lobulation with the appearance of multiple masses. Patent and antegrade flow in the main portal vein. Portal vein is patent on color Doppler imaging with normal direction of blood flow towards the liver. IMPRESSION: 1. Very heterogeneous  liver, likely widespread hepatic metastatic disease. Recommend abdomen and pelvis CT with contrast. 2. Cholelithiasis without cholecystitis. Electronically Signed   By: Monte Fantasia M.D.   On: 03/03/2017 10:49    ASSESSMENT & PLAN:   1. Metastatic carcinoma with metastasis to the liver, unknown primary  No other overt focal symptoms suggestive of primary tumor Component     Latest Ref Rng & Units 03/08/2017  CA 19-9     0 - 35 U/mL 179 (H)  CA 27.29     0.0 - 38.6 U/mL 7,528.8 (H)  CA 15-3     0.0 - 25.0 U/mL 2,161.0 (H)  Cancer Antigen (CA) 125     0.0 - 38.1 U/mL 1,923.0 (H)  CEA ([facility])     0.00 - 5.00 ng/mL 13,146.58 (H)   Tumor markers - noted.  Based on IHC and tumor markers -cannot r/o Breast/lung/colonic/Ovarian primary - no very definitive. -Starting treatment immediately as a lung primary for the sake of time, with carboplatin/Taxol chemotherapy which started on 03/19/17 Plan -Abdominal pain controlled with MS contin '60mg'$  [id number] + prn Oxycodone -Nausea uncontrolled. Will continue Zofran in addition to scopolamine patch. Continue Dexamethasone. -Her molecular studies (ER/PR/Her2, foundation One) are still pending.  -She is scheduled for a mammogram and PET scan this  month so we can evaluate primary to potentially make more treatment options available. -We previously discussed how we will likely treat this immediately as primary lung cancer for the sake of time and change course as imaging, biopsy, and labs dictate.  -I also informed her that she could consider comfort care as needed, she will think about it and would like to consider her options based on further testing. She also would like to begin treatment as of right now.  -We will continue looking for her primary tumor at this time. If there is not enough tissue, she agrees to biopsy one of her axillary lymph nodes or other areas as her PET dictates. -She declines port placement at this time.  -Labs reviewed, adequate to proceed with cycle 1 Carboplatin/Taxol today. (dose reduced taxol based on liver function)  2. Goal of care discussion  -We discussed the incurable nature of her cancer, and the overall poor prognosis, especially if she does not have good response to chemotherapy or progress on chemo -The patient understands the goal of care is palliative.  3. Financial Support -Will refer to social work to help with FMLA and with disability paperwork.   Prescribe scopolamine patch  RTC with Dr Irene Limbo in 10 days with labs for toxicity check Schedule C2 of carboplatin/Taxol chemotherapy in 3 weeks with labs Plz ensure previously scheduled Mammogram and breast US has been scheduled  All of the patients questions were answered with apparent satisfaction. The patient knows to call the clinic with any problems, questions or concerns.  I spent 30 minutes counseling the patient face to face. The total time spent in the appointment was 40 minutes and more than 50% was on counseling and direct patient cares.  Sullivan Lone MD [place] AAHIVMS [facility] Hematology/Oncology Physician [facility]  (Office):       [phone] (Work cell):  [phone] (Fax):           [phone]  03/19/2017 11:57  AM  This document serves as a record of services personally performed by Sullivan Lone, MD. It was created on her behalf by Joslyn Devon, a trained medical scribe. The creation of this record is based on the  scribe's personal observations and the provider's statements to them. This document has been checked and approved by the attending provider.

## 2017-03-19 ENCOUNTER — Encounter: Payer: Self-pay | Admitting: Hematology

## 2017-03-19 ENCOUNTER — Telehealth: Payer: Self-pay | Admitting: Hematology

## 2017-03-19 ENCOUNTER — Ambulatory Visit (HOSPITAL_BASED_OUTPATIENT_CLINIC_OR_DEPARTMENT_OTHER): Payer: Self-pay

## 2017-03-19 ENCOUNTER — Ambulatory Visit (HOSPITAL_BASED_OUTPATIENT_CLINIC_OR_DEPARTMENT_OTHER): Payer: Self-pay | Admitting: Hematology

## 2017-03-19 ENCOUNTER — Other Ambulatory Visit (HOSPITAL_BASED_OUTPATIENT_CLINIC_OR_DEPARTMENT_OTHER): Payer: Self-pay

## 2017-03-19 VITALS — BP 154/96 | HR 76 | Temp 97.7°F | Resp 18 | Ht 60.0 in | Wt 180.6 lb

## 2017-03-19 VITALS — BP 162/81 | HR 62 | Temp 98.3°F | Resp 17

## 2017-03-19 DIAGNOSIS — Z7189 Other specified counseling: Secondary | ICD-10-CM

## 2017-03-19 DIAGNOSIS — G893 Neoplasm related pain (acute) (chronic): Secondary | ICD-10-CM

## 2017-03-19 DIAGNOSIS — C799 Secondary malignant neoplasm of unspecified site: Secondary | ICD-10-CM

## 2017-03-19 DIAGNOSIS — Z5111 Encounter for antineoplastic chemotherapy: Secondary | ICD-10-CM

## 2017-03-19 DIAGNOSIS — C787 Secondary malignant neoplasm of liver and intrahepatic bile duct: Secondary | ICD-10-CM

## 2017-03-19 DIAGNOSIS — C801 Malignant (primary) neoplasm, unspecified: Secondary | ICD-10-CM

## 2017-03-19 DIAGNOSIS — R110 Nausea: Secondary | ICD-10-CM

## 2017-03-19 DIAGNOSIS — R112 Nausea with vomiting, unspecified: Secondary | ICD-10-CM

## 2017-03-19 LAB — COMPREHENSIVE METABOLIC PANEL
ALT: 35 U/L (ref 0–55)
AST: 140 U/L — AB (ref 5–34)
Albumin: 2.3 g/dL — ABNORMAL LOW (ref 3.5–5.0)
Alkaline Phosphatase: 240 U/L — ABNORMAL HIGH (ref 40–150)
Anion Gap: 17 mEq/L — ABNORMAL HIGH (ref 3–11)
BUN: 12.6 mg/dL (ref 7.0–26.0)
CALCIUM: 8.9 mg/dL (ref 8.4–10.4)
CHLORIDE: 90 meq/L — AB (ref 98–109)
CO2: 24 mEq/L (ref 22–29)
CREATININE: 0.8 mg/dL (ref 0.6–1.1)
EGFR: >60 mL/min/{1.73_m2} (ref >60)
Glucose: 82 mg/dl (ref 70–140)
Potassium: 4.7 mEq/L (ref 3.5–5.1)
Sodium: 132 mEq/L — ABNORMAL LOW (ref 136–145)
TOTAL PROTEIN: 7.3 g/dL (ref 6.4–8.3)
Total Bilirubin: 1.79 mg/dL — ABNORMAL HIGH (ref 0.20–1.20)

## 2017-03-19 LAB — CBC & DIFF AND RETIC
BASO%: 0.1 % (ref 0.0–2.0)
Basophils Absolute: 0 10*3/uL (ref 0.0–0.1)
EOS ABS: 0 10*3/uL (ref 0.0–0.5)
EOS%: 0 % (ref 0.0–7.0)
HEMATOCRIT: 32.4 % — AB (ref 34.8–46.6)
HEMOGLOBIN: 10.4 g/dL — AB (ref 11.6–15.9)
IMMATURE RETIC FRACT: 30.5 % — AB (ref 1.60–10.00)
LYMPH#: 1.1 10*3/uL (ref 0.9–3.3)
LYMPH%: 5 % — ABNORMAL LOW (ref 14.0–49.7)
MCH: 25.9 pg (ref 25.1–34.0)
MCHC: 32.1 g/dL (ref 31.5–36.0)
MCV: 80.8 fL (ref 79.5–101.0)
MONO#: 1.5 10*3/uL — AB (ref 0.1–0.9)
MONO%: 7 % (ref 0.0–14.0)
NEUT#: 18.9 10*3/uL — ABNORMAL HIGH (ref 1.5–6.5)
NEUT%: 87.9 % — AB (ref 38.4–76.8)
Platelets: 450 10*3/uL — ABNORMAL HIGH (ref 145–400)
RBC: 4.01 10*6/uL (ref 3.70–5.45)
RDW: 16.9 % — ABNORMAL HIGH (ref 11.2–14.5)
RETIC %: 4.31 % — AB (ref 0.70–2.10)
RETIC CT ABS: 172.83 10*3/uL — AB (ref 33.70–90.70)
WBC: 21.5 10*3/uL — ABNORMAL HIGH (ref 3.9–10.3)
nRBC: 0 % (ref 0–0)

## 2017-03-19 MED ORDER — DIPHENHYDRAMINE HCL 50 MG/ML IJ SOLN
50.0000 mg | Freq: Once | INTRAMUSCULAR | Status: AC
  Administered 2017-03-19: 50 mg via INTRAVENOUS

## 2017-03-19 MED ORDER — SODIUM CHLORIDE 0.9 % IV SOLN
135.0000 mg/m2 | Freq: Once | INTRAVENOUS | Status: AC
  Administered 2017-03-19: 258 mg via INTRAVENOUS
  Filled 2017-03-19: qty 43

## 2017-03-19 MED ORDER — DIPHENHYDRAMINE HCL 50 MG/ML IJ SOLN
INTRAMUSCULAR | Status: AC
  Filled 2017-03-19: qty 1

## 2017-03-19 MED ORDER — PALONOSETRON HCL INJECTION 0.25 MG/5ML
INTRAVENOUS | Status: AC
  Filled 2017-03-19: qty 5

## 2017-03-19 MED ORDER — FAMOTIDINE IN NACL 20-0.9 MG/50ML-% IV SOLN
20.0000 mg | Freq: Once | INTRAVENOUS | Status: AC
  Administered 2017-03-19: 20 mg via INTRAVENOUS

## 2017-03-19 MED ORDER — CARBOPLATIN CHEMO INJECTION 600 MG/60ML
690.0000 mg | Freq: Once | INTRAVENOUS | Status: AC
  Administered 2017-03-19: 690 mg via INTRAVENOUS
  Filled 2017-03-19: qty 69

## 2017-03-19 MED ORDER — SCOPOLAMINE 1 MG/3DAYS TD PT72: 1.0000 | MEDICATED_PATCH | TRANSDERMAL | 1 refills | Status: DC

## 2017-03-19 MED ORDER — SODIUM CHLORIDE 0.9 % IV SOLN: 690.5000 mg | Freq: Once | INTRAVENOUS | Status: DC

## 2017-03-19 MED ORDER — SODIUM CHLORIDE 0.9 % IV SOLN
Freq: Once | INTRAVENOUS | Status: AC
  Administered 2017-03-19: 13:00:00 via INTRAVENOUS

## 2017-03-19 MED ORDER — PALONOSETRON HCL INJECTION 0.25 MG/5ML
0.2500 mg | Freq: Once | INTRAVENOUS | Status: AC
  Administered 2017-03-19: 0.25 mg via INTRAVENOUS

## 2017-03-19 MED ORDER — FAMOTIDINE IN NACL 20-0.9 MG/50ML-% IV SOLN
INTRAVENOUS | Status: AC
  Filled 2017-03-19: qty 50

## 2017-03-19 MED ORDER — DEXAMETHASONE SODIUM PHOSPHATE 100 MG/10ML IJ SOLN
20.0000 mg | Freq: Once | INTRAMUSCULAR | Status: AC
  Administered 2017-03-19: 20 mg via INTRAVENOUS
  Filled 2017-03-19: qty 2

## 2017-03-19 NOTE — Patient Instructions (Signed)
[place] Discharge Instructions for Patients Receiving Chemotherapy  Today you received the following chemotherapy agents Carbo & Taxol  To help prevent nausea and vomiting after your treatment, we encourage you to take your nausea medication as prescribed   If you develop nausea and vomiting that is not controlled by your nausea medication, call the clinic.   BELOW ARE SYMPTOMS THAT SHOULD BE REPORTED IMMEDIATELY:  *FEVER GREATER THAN 100.5 F  *CHILLS WITH OR WITHOUT FEVER  NAUSEA AND VOMITING THAT IS NOT CONTROLLED WITH YOUR NAUSEA MEDICATION  *UNUSUAL SHORTNESS OF BREATH  *UNUSUAL BRUISING OR BLEEDING  TENDERNESS IN MOUTH AND THROAT WITH OR WITHOUT PRESENCE OF ULCERS  *URINARY PROBLEMS  *BOWEL PROBLEMS  UNUSUAL RASH Items with * indicate a potential emergency and should be followed up as soon as possible.  Feel free to call the clinic should you have any questions or concerns. The clinic phone number is [phone].  Please show the [place] at check-in to the Emergency Department and triage nurse.  Carboplatin injection (Carbo) What is this medicine? CARBOPLATIN (KAR boe pla tin) is a chemotherapy drug. It targets fast dividing cells, like cancer cells, and causes these cells to die. This medicine is used to treat ovarian cancer and many other cancers. This medicine may be used for other purposes; ask your health care provider or pharmacist if you have questions. COMMON BRAND NAME(S): Paraplatin What should I tell my health care provider before I take this medicine? They need to know if you have any of these conditions: -blood disorders -hearing problems -kidney disease -recent or ongoing radiation therapy -an unusual or allergic reaction to carboplatin, cisplatin, other chemotherapy, other medicines, foods, dyes, or preservatives -pregnant or trying to get pregnant -breast-feeding How should I use this medicine? This drug is usually  given as an infusion into a vein. It is administered in a hospital or clinic by a specially trained health care professional. Talk to your pediatrician regarding the use of this medicine in children. Special care may be needed. Overdosage: If you think you have taken too much of this medicine contact a poison control center or emergency room at once. NOTE: This medicine is only for you. Do not share this medicine with others. What if I miss a dose? It is important not to miss a dose. Call your doctor or health care professional if you are unable to keep an appointment. What may interact with this medicine? -medicines for seizures -medicines to increase blood counts like filgrastim, pegfilgrastim, sargramostim -some antibiotics like amikacin, gentamicin, neomycin, streptomycin, tobramycin -vaccines Talk to your doctor or health care professional before taking any of these medicines: -acetaminophen -aspirin -ibuprofen -ketoprofen -naproxen This list may not describe all possible interactions. Give your health care provider a list of all the medicines, herbs, non-prescription drugs, or dietary supplements you use. Also tell them if you smoke, drink alcohol, or use illegal drugs. Some items may interact with your medicine. What should I watch for while using this medicine? Your condition will be monitored carefully while you are receiving this medicine. You will need important blood work done while you are taking this medicine. This drug may make you feel generally unwell. This is not uncommon, as chemotherapy can affect healthy cells as well as cancer cells. Report any side effects. Continue your course of treatment even though you feel ill unless your doctor tells you to stop. In some cases, you may be given additional medicines to help with side  effects. Follow all directions for their use. Call your doctor or health care professional for advice if you get a fever, chills or sore throat, or  other symptoms of a cold or flu. Do not treat yourself. This drug decreases your body's ability to fight infections. Try to avoid being around people who are sick. This medicine may increase your risk to bruise or bleed. Call your doctor or health care professional if you notice any unusual bleeding. Be careful brushing and flossing your teeth or using a toothpick because you may get an infection or bleed more easily. If you have any dental work done, tell your dentist you are receiving this medicine. Avoid taking products that contain aspirin, acetaminophen, ibuprofen, naproxen, or ketoprofen unless instructed by your doctor. These medicines may hide a fever. Do not become pregnant while taking this medicine. Women should inform their doctor if they wish to become pregnant or think they might be pregnant. There is a potential for serious side effects to an unborn child. Talk to your health care professional or pharmacist for more information. Do not breast-feed an infant while taking this medicine. What side effects may I notice from receiving this medicine? Side effects that you should report to your doctor or health care professional as soon as possible: -allergic reactions like skin rash, itching or hives, swelling of the face, lips, or tongue -signs of infection - fever or chills, cough, sore throat, pain or difficulty passing urine -signs of decreased platelets or bleeding - bruising, pinpoint red spots on the skin, black, tarry stools, nosebleeds -signs of decreased red blood cells - unusually weak or tired, fainting spells, lightheadedness -breathing problems -changes in hearing -changes in vision -chest pain -high blood pressure -low blood counts - This drug may decrease the number of white blood cells, red blood cells and platelets. You may be at increased risk for infections and bleeding. -nausea and vomiting -pain, swelling, redness or irritation at the injection site -pain, tingling,  numbness in the hands or feet -problems with balance, talking, walking -trouble passing urine or change in the amount of urine Side effects that usually do not require medical attention (report to your doctor or health care professional if they continue or are bothersome): -hair loss -loss of appetite -metallic taste in the mouth or changes in taste This list may not describe all possible side effects. Call your doctor for medical advice about side effects. You may report side effects to FDA at 1-800-FDA-1088. Where should I keep my medicine? This drug is given in a hospital or clinic and will not be stored at home. NOTE: This sheet is a summary. It may not cover all possible information. If you have questions about this medicine, talk to your doctor, pharmacist, or health care provider.  2018 Elsevier/Gold Standard (2007-08-27 14:38:05)   Paclitaxel injection (Taxol) What is this medicine? PACLITAXEL (PAK li TAX el) is a chemotherapy drug. It targets fast dividing cells, like cancer cells, and causes these cells to die. This medicine is used to treat ovarian cancer, breast cancer, and other cancers. This medicine may be used for other purposes; ask your health care provider or pharmacist if you have questions. COMMON BRAND NAME(S): Onxol, Taxol What should I tell my health care provider before I take this medicine? They need to know if you have any of these conditions: -blood disorders -irregular heartbeat -infection (especially a virus infection such as chickenpox, cold sores, or herpes) -liver disease -previous or ongoing radiation therapy -an unusual  or allergic reaction to paclitaxel, alcohol, polyoxyethylated castor oil, other chemotherapy agents, other medicines, foods, dyes, or preservatives -pregnant or trying to get pregnant -breast-feeding How should I use this medicine? This drug is given as an infusion into a vein. It is administered in a hospital or clinic by a specially  trained health care professional. Talk to your pediatrician regarding the use of this medicine in children. Special care may be needed. Overdosage: If you think you have taken too much of this medicine contact a poison control center or emergency room at once. NOTE: This medicine is only for you. Do not share this medicine with others. What if I miss a dose? It is important not to miss your dose. Call your doctor or health care professional if you are unable to keep an appointment. What may interact with this medicine? Do not take this medicine with any of the following medications: -disulfiram -metronidazole This medicine may also interact with the following medications: -cyclosporine -diazepam -ketoconazole -medicines to increase blood counts like filgrastim, pegfilgrastim, sargramostim -other chemotherapy drugs like cisplatin, doxorubicin, epirubicin, etoposide, teniposide, vincristine -quinidine -testosterone -vaccines -verapamil Talk to your doctor or health care professional before taking any of these medicines: -acetaminophen -aspirin -ibuprofen -ketoprofen -naproxen This list may not describe all possible interactions. Give your health care provider a list of all the medicines, herbs, non-prescription drugs, or dietary supplements you use. Also tell them if you smoke, drink alcohol, or use illegal drugs. Some items may interact with your medicine. What should I watch for while using this medicine? Your condition will be monitored carefully while you are receiving this medicine. You will need important blood work done while you are taking this medicine. This medicine can cause serious allergic reactions. To reduce your risk you will need to take other medicine(s) before treatment with this medicine. If you experience allergic reactions like skin rash, itching or hives, swelling of the face, lips, or tongue, tell your doctor or health care professional right away. In some cases,  you may be given additional medicines to help with side effects. Follow all directions for their use. This drug may make you feel generally unwell. This is not uncommon, as chemotherapy can affect healthy cells as well as cancer cells. Report any side effects. Continue your course of treatment even though you feel ill unless your doctor tells you to stop. Call your doctor or health care professional for advice if you get a fever, chills or sore throat, or other symptoms of a cold or flu. Do not treat yourself. This drug decreases your body's ability to fight infections. Try to avoid being around people who are sick. This medicine may increase your risk to bruise or bleed. Call your doctor or health care professional if you notice any unusual bleeding. Be careful brushing and flossing your teeth or using a toothpick because you may get an infection or bleed more easily. If you have any dental work done, tell your dentist you are receiving this medicine. Avoid taking products that contain aspirin, acetaminophen, ibuprofen, naproxen, or ketoprofen unless instructed by your doctor. These medicines may hide a fever. Do not become pregnant while taking this medicine. Women should inform their doctor if they wish to become pregnant or think they might be pregnant. There is a potential for serious side effects to an unborn child. Talk to your health care professional or pharmacist for more information. Do not breast-feed an infant while taking this medicine. Men are advised not to father a  child while receiving this medicine. This product may contain alcohol. Ask your pharmacist or healthcare provider if this medicine contains alcohol. Be sure to tell all healthcare providers you are taking this medicine. Certain medicines, like metronidazole and disulfiram, can cause an unpleasant reaction when taken with alcohol. The reaction includes flushing, headache, nausea, vomiting, sweating, and increased thirst. The  reaction can last from 30 minutes to several hours. What side effects may I notice from receiving this medicine? Side effects that you should report to your doctor or health care professional as soon as possible: -allergic reactions like skin rash, itching or hives, swelling of the face, lips, or tongue -low blood counts - This drug may decrease the number of white blood cells, red blood cells and platelets. You may be at increased risk for infections and bleeding. -signs of infection - fever or chills, cough, sore throat, pain or difficulty passing urine -signs of decreased platelets or bleeding - bruising, pinpoint red spots on the skin, black, tarry stools, nosebleeds -signs of decreased red blood cells - unusually weak or tired, fainting spells, lightheadedness -breathing problems -chest pain -high or low blood pressure -mouth sores -nausea and vomiting -pain, swelling, redness or irritation at the injection site -pain, tingling, numbness in the hands or feet -slow or irregular heartbeat -swelling of the ankle, feet, hands Side effects that usually do not require medical attention (report to your doctor or health care professional if they continue or are bothersome): -bone pain -complete hair loss including hair on your head, underarms, pubic hair, eyebrows, and eyelashes -changes in the color of fingernails -diarrhea -loosening of the fingernails -loss of appetite -muscle or joint pain -red flush to skin -sweating This list may not describe all possible side effects. Call your doctor for medical advice about side effects. You may report side effects to FDA at 1-800-FDA-1088. Where should I keep my medicine? This drug is given in a hospital or clinic and will not be stored at home. NOTE: This sheet is a summary. It may not cover all possible information. If you have questions about this medicine, talk to your doctor, pharmacist, or health care provider.  2018 Elsevier/Gold  Standard (2015-03-23 19:58:00)

## 2017-03-19 NOTE — Patient Instructions (Signed)
Thank you for choosing [facility] to provide your oncology and hematology care.  To afford each patient quality time with our providers, please arrive 30 minutes before your scheduled appointment time.  If you arrive late for your appointment, you may be asked to reschedule.  We strive to give you quality time with our providers, and arriving late affects you and other patients whose appointments are after yours.   If you are a no show for multiple scheduled visits, you may be dismissed from the clinic at the providers discretion.    Again, thank you for choosing [facility], our hope is that these requests will decrease the amount of time that you wait before being seen by our physicians.  ______________________________________________________________________  Should you have questions after your visit to the [facility], please contact our office at [phone] between the hours of 8:30 and 4:30 p.m.    Voicemails left after 4:30p.m will not be returned until the following business day.    For prescription refill requests, please have your pharmacy contact us directly.  Please also try to allow 48 hours for prescription requests.    Please contact the scheduling department for questions regarding scheduling.  For scheduling of procedures such as PET scans, CT scans, MRI, Ultrasound, etc please contact central scheduling at [phone].    Resources For Cancer Patients and Caregivers:   Oncolink.org:  A wonderful resource for patients and healthcare providers for information regarding your disease, ways to tract your treatment, what to expect, etc.     American Cancer Society:  800-227-2345  Can help patients locate various types of support and financial assistance  Cancer Care: 1-800-813-HOPE (4673) Provides financial assistance, online support groups, medication/co-pay assistance.    [place] DSS:  336-641-3447 Where to apply for food  stamps, Medicaid, and utility assistance  Medicare Rights Center: 800-333-4114 Helps people with Medicare understand their rights and benefits, navigate the Medicare system, and secure the quality healthcare they deserve  [employer]: 336-333-6589 [employer]'s shared-ride transportation service for eligible riders who have a disability that prevents them from riding the fixed route bus.    For additional information on assistance programs please contact our social worker:   Grier Hock/Abigail Elmore:  [phone]            

## 2017-03-19 NOTE — Progress Notes (Signed)
SW [place] in pathology.  Foundation 1 test and ER/PR Her 2 testing has not been ordered, will send out as of today.  Dr. Irene Limbo aware.

## 2017-03-19 NOTE — Telephone Encounter (Signed)
Scheduled appt per 10/15 los -patient to get updated schedule in the treatment area - per [facility] - patient will be called.

## 2017-03-19 NOTE — Progress Notes (Signed)
Per Dr Irene Limbo note ok to tx with today's labs.

## 2017-03-20 ENCOUNTER — Telehealth: Payer: Self-pay | Admitting: *Deleted

## 2017-03-20 NOTE — Telephone Encounter (Signed)
Called patient for chemo f/u.  No answer.  LVM to call back with questions/concerns.  Call back number provided.

## 2017-03-22 ENCOUNTER — Telehealth: Payer: Self-pay | Admitting: *Deleted

## 2017-03-22 ENCOUNTER — Emergency Department (HOSPITAL_COMMUNITY)
Admission: EM | Admit: 2017-03-22 | Discharge: 2017-03-22 | Disposition: A | Discharge status: Home / Self Care | Payer: Medicaid Other | Attending: Emergency Medicine | Admitting: Emergency Medicine

## 2017-03-22 ENCOUNTER — Emergency Department (HOSPITAL_COMMUNITY): Payer: Medicaid Other

## 2017-03-22 ENCOUNTER — Other Ambulatory Visit: Payer: Self-pay

## 2017-03-22 ENCOUNTER — Encounter (HOSPITAL_COMMUNITY): Payer: Self-pay

## 2017-03-22 DIAGNOSIS — Z87891 Personal history of nicotine dependence: Secondary | ICD-10-CM | POA: Diagnosis not present

## 2017-03-22 DIAGNOSIS — K121 Other forms of stomatitis: Secondary | ICD-10-CM

## 2017-03-22 DIAGNOSIS — N898 Other specified noninflammatory disorders of vagina: Secondary | ICD-10-CM | POA: Insufficient documentation

## 2017-03-22 DIAGNOSIS — R0602 Shortness of breath: Secondary | ICD-10-CM | POA: Diagnosis present

## 2017-03-22 DIAGNOSIS — K120 Recurrent oral aphthae: Secondary | ICD-10-CM | POA: Insufficient documentation

## 2017-03-22 DIAGNOSIS — Z79899 Other long term (current) drug therapy: Secondary | ICD-10-CM | POA: Diagnosis not present

## 2017-03-22 DIAGNOSIS — I10 Essential (primary) hypertension: Secondary | ICD-10-CM | POA: Insufficient documentation

## 2017-03-22 LAB — BASIC METABOLIC PANEL
Anion gap: 16 — ABNORMAL HIGH (ref 5–15)
BUN: 34 mg/dL — AB (ref 6–20)
CALCIUM: 8.5 mg/dL — AB (ref 8.9–10.3)
CHLORIDE: 85 mmol/L — AB (ref 101–111)
CO2: 24 mmol/L (ref 22–32)
CREATININE: 0.99 mg/dL (ref 0.44–1.00)
GFR calc Af Amer: >60 mL/min (ref >60)
GFR calc non Af Amer: >60 mL/min (ref >60)
Glucose, Bld: 116 mg/dL — ABNORMAL HIGH (ref 65–99)
Potassium: 5.2 mmol/L — ABNORMAL HIGH (ref 3.5–5.1)
Sodium: 125 mmol/L — ABNORMAL LOW (ref 135–145)

## 2017-03-22 LAB — CBC
HCT: 32.3 % — ABNORMAL LOW (ref 36.0–46.0)
Hemoglobin: 10.5 g/dL — ABNORMAL LOW (ref 12.0–15.0)
MCH: 25.5 pg — AB (ref 26.0–34.0)
MCHC: 32.5 g/dL (ref 30.0–36.0)
MCV: 78.6 fL (ref 78.0–100.0)
PLATELETS: 317 10*3/uL (ref 150–400)
RBC: 4.11 MIL/uL (ref 3.87–5.11)
RDW: 17 % — ABNORMAL HIGH (ref 11.5–15.5)
WBC: 21.1 10*3/uL — ABNORMAL HIGH (ref 4.0–10.5)

## 2017-03-22 LAB — PROTIME-INR
INR: 1.31
PROTHROMBIN TIME: 16.1 s — AB (ref 11.4–15.2)

## 2017-03-22 MED ORDER — MAGIC MOUTHWASH: 5.0000 mL | Freq: Four times a day (QID) | ORAL | 0 refills | Status: DC | PRN

## 2017-03-22 MED ORDER — MAGIC MOUTHWASH
10.0000 mL | Freq: Once | ORAL | Status: AC
  Administered 2017-03-22: 10 mL via ORAL
  Filled 2017-03-22: qty 10

## 2017-03-22 MED ORDER — IOPAMIDOL (ISOVUE-370) INJECTION 76%
INTRAVENOUS | Status: AC
  Administered 2017-03-22: 100 mL via INTRAVENOUS
  Filled 2017-03-22: qty 100

## 2017-03-22 NOTE — Telephone Encounter (Signed)
Pt's husband called stating pt is having continuous SOB and pink vaginal discharge.  Pt received first chemotherapy earlier this week.  This RN stated to husband that if patient continues to have worsening SOB patient should present to ED.  Informed that clinic is currently closing, and Dr. Irene Limbo is not currently available.  If symptoms do not worsen, patient could be seen tomorrow by symptom management clinic.  Husband stated he would continue to monitor pt and bring her to ED if symptoms worsen.

## 2017-03-22 NOTE — Telephone Encounter (Signed)
Received call from pt's husband, Octavia Bruckner, stating that pt c/o of SOB & vaginal d/c.  She doesn't look SOB sitting still but does have labored breathing with exertion.  She has a light pink vaginal d/c which is new.  She usually does not have a period. Recent chemotherapy with Taxol/Carbo. Message sent to Dr Launa Flight RN.

## 2017-03-22 NOTE — ED Notes (Signed)
Bed: WA06 Expected date:  Expected time:  Means of arrival:  Comments: 

## 2017-03-22 NOTE — ED Notes (Signed)
Lungs clear in all fields

## 2017-03-22 NOTE — ED Triage Notes (Addendum)
Patient c/o increased SOB, light vaginal bleeding. Patient states she was having spotting, but bleeding has increased. Patient has not had a period x 7 years. Patient states she last had chemo on 03/19/17

## 2017-03-22 NOTE — ED Provider Notes (Signed)
[facility] DEPT Provider Note   CSN: [id number] Arrival date & time: 03/22/17  1810     History   Chief Complaint Chief Complaint  Patient presents with  . Shortness of Breath  . Cancer patient    HPI Marisa Kim is a 51 y.o. female.  The patient is here for evaluation of shortness of breath and vaginal bleeding.  She was recently started on chemotherapy, for metastatic cancer, unspecified but possibly related to primary lung cancer.  She has a remote history of cervical cancer, treated with conization procedure.  She has been feeling gradually worse, since her chemotherapy, 3 days ago.  She is feeling weak and has shortness of breath.  She also has an oral ulcer, below her teeth, bottom, which started within the last 3 days.  She denies fever, chills, productive cough, wheezing, headache, chest pain or back pain.  She describes a mild vaginal discharge which is somewhat pink in color.  She prefers to have this evaluated by her PCP, at a later time.  There are no other known modifying factors.  HPI  Past Medical History:  Diagnosis Date  . Cervical ca ([place])   . Pulmonary stenosis     Patient Active Problem List   Diagnosis Date Noted  . Counseling regarding advanced care planning and goals of care 03/13/2017  . Metastatic carcinoma ([place]) 03/08/2017  . Abdominal pain   . Liver lesion   . Liver mass   . Hypotension 03/03/2017  . AKI (acute kidney injury) ([place]) 03/03/2017  . LFT elevation 03/03/2017  . Leucocytosis 03/03/2017  . Pyuria 03/03/2017  . Lactic acid acidosis 03/03/2017  . Hyponatremia 03/03/2017  . Liver metastases ([place]) 03/03/2017  . Cervical cancer ([place]) 03/03/2017  . Right lower lobe lung mass 03/03/2017  . Cholelithiasis 03/03/2017  . RUQ pain 03/03/2017    Past Surgical History:  Procedure Laterality Date  . CARDIAC SURGERY    . CERVIX SURGERY     30 years ago    OB History    No data available        Home Medications    Prior to Admission medications   Medication Sig Start Date End Date Taking? Authorizing Provider  dexamethasone (DECADRON) 4 MG tablet Take 2 tablets (8 mg total) by mouth daily. Start the day after chemotherapy for 2 days. 03/13/17  Yes Brunetta Genera, MD  morphine (MS CONTIN) 60 MG 12 hr tablet Take 1 tablet (60 mg total) by mouth every 12 (twelve) hours. 03/12/17  Yes Brunetta Genera, MD  ondansetron (ZOFRAN) 4 MG tablet Take 1 tablet (4 mg total) by mouth 3 (three) times daily. Take 1 tablet 3 times daily x 5 days, then every 6 hours as needed. 03/08/17  Yes Eugenie Filler, MD  prochlorperazine (COMPAZINE) 10 MG tablet Take 1 tablet (10 mg total) by mouth every 6 (six) hours as needed (Nausea or vomiting). 03/13/17  Yes Brunetta Genera, MD  LORazepam (ATIVAN) 0.5 MG tablet Take 1 tablet (0.5 mg total) by mouth every 6 (six) hours as needed (Nausea or vomiting). 03/13/17   Brunetta Genera, MD  magic mouthwash SOLN Take 5 mLs by mouth 4 (four) times daily as needed for mouth pain. 03/22/17   Daleen Bo, MD  ondansetron (ZOFRAN) 8 MG tablet Take 1 tablet (8 mg total) by mouth 2 (two) times daily as needed for refractory nausea / vomiting. Start on day 3 after chemo. Patient not taking: Reported on  03/22/2017 03/13/17   Brunetta Genera, MD  oxyCODONE 10 MG TABS Take 1 tablet (10 mg total) by mouth every 4 (four) hours as needed for moderate pain. 03/12/17   Brunetta Genera, MD  polyethylene glycol [facility] / Floria Raveling) packet Take 17 g by mouth daily. Patient not taking: Reported on 03/22/2017 03/08/17   Eugenie Filler, MD  scopolamine (TRANSDERM-SCOP) 1 MG/3DAYS Place 1 patch (1.5 mg total) onto the skin every 3 (three) days. 03/19/17   Brunetta Genera, MD  senna-docusate (SENOKOT-S) 8.6-50 MG tablet Take 1 tablet by mouth 2 (two) times daily. Patient not taking: Reported on 03/22/2017 03/08/17   Eugenie Filler, MD    Family  History Family History  Problem Relation Age of Onset  . Lung cancer Mother 94  . Emphysema Father     Social History Social History  Substance Use Topics  . Smoking status: Former Smoker    Packs/day: 2.00    Years: 30.00    Quit date: 03/03/2010  . Smokeless tobacco: Never Used  . Alcohol use No     Allergies   Patient has no known allergies.   Review of Systems Review of Systems  All other systems reviewed and are negative.    Physical Exam Updated Vital Signs BP (!) 197/83 (BP Location: Right Arm)   Pulse 94   Temp (!) 97.5 F (36.4 C) (Oral)   Resp 18   Ht 5' (1.524 m)   Wt 82.6 kg (182 lb)   SpO2 94%   BMI 35.54 kg/m   Physical Exam  Constitutional: She is oriented to person, place, and time. She appears well-developed. She appears distressed (She is uncomfortable).  She appears older than stated age, she is somewhat overweight.  HENT:  Head: Normocephalic and atraumatic.  Eyes: Pupils are equal, round, and reactive to light. Conjunctivae and EOM are normal.  Neck: Normal range of motion and phonation normal. Neck supple.  Cardiovascular: Normal rate and regular rhythm.   Pulmonary/Chest: Effort normal and breath sounds normal. She exhibits no tenderness.  Abdominal: Soft. She exhibits no distension. There is no tenderness. There is no guarding.  Musculoskeletal: Normal range of motion.  Neurological: She is alert and oriented to person, place, and time. She exhibits normal muscle tone.  Skin: Skin is warm and dry.  Mild bruising both forearms, at site of current IV, and prior IV from hospitalization.  Psychiatric: She has a normal mood and affect. Her behavior is normal. Judgment and thought content normal.  Nursing note and vitals reviewed.    ED Treatments / Results  Labs (all labs ordered are listed, but only abnormal results are displayed) Labs Reviewed  BASIC METABOLIC PANEL - Abnormal; Notable for the following:       Result Value    Sodium 125 (*)    Potassium 5.2 (*)    Chloride 85 (*)    Glucose, Bld 116 (*)    BUN 34 (*)    Calcium 8.5 (*)    Anion gap 16 (*)    All other components within normal limits  CBC - Abnormal; Notable for the following:    WBC 21.1 (*)    Hemoglobin 10.5 (*)    HCT 32.3 (*)    MCH 25.5 (*)    RDW 17.0 (*)    All other components within normal limits  PROTIME-INR - Abnormal; Notable for the following:    Prothrombin Time 16.1 (*)    All other components within  normal limits   Hemoglobin  Date Value Ref Range Status  03/22/2017 10.5 (L) 12.0 - 15.0 g/dL Final  03/08/2017 9.0 (L) 12.0 - 15.0 g/dL Final  03/07/2017 10.1 (L) 12.0 - 15.0 g/dL Final  03/06/2017 9.4 (L) 12.0 - 15.0 g/dL Final   HGB  Date Value Ref Range Status  03/19/2017 10.4 (L) 11.6 - 15.9 g/dL Final    EKG  EKG Interpretation  Date/Time:  Thursday March 22 2017 18:37:13 EDT Ventricular Rate:  88 PR Interval:    QRS Duration: 112 QT Interval:  366 QTC Calculation: 443 R Axis:   94 Text Interpretation:  Sinus rhythm Probable left atrial enlargement Incomplete right bundle branch block since last tracing no significant change Confirmed by Daleen Bo [phone]) on 03/22/2017 7:26:33 PM       Radiology Dg Chest 2 View  Result Date: 03/22/2017 CLINICAL DATA:  Shortness of breath. EXAM: CHEST  2 VIEW COMPARISON:  03/03/2017 FINDINGS: The lungs are clear without focal pneumonia, edema, pneumothorax or pleural effusion. The cardiopericardial silhouette is within normal limits for size. The visualized bony structures of the thorax are intact. Old right rib fractures evident. Telemetry leads overlie the chest. IMPRESSION: No active cardiopulmonary disease. Electronically Signed   By: Misty Stanley M.D.   On: 03/22/2017 20:24   Ct Angio Chest Pe W/cm &/or Wo Cm  Result Date: 03/22/2017 CLINICAL DATA:  Increasing shortness of breath. Vaginal bleeding. No periods for 7 years. Chemotherapy on 03/19/2017 EXAM:  CT ANGIOGRAPHY CHEST WITH CONTRAST TECHNIQUE: Multidetector CT imaging of the chest was performed using the standard protocol during bolus administration of intravenous contrast. Multiplanar CT image reconstructions and MIPs were obtained to evaluate the vascular anatomy. CONTRAST:  100 mL Isovue 370 COMPARISON:  03/03/2017 FINDINGS: Cardiovascular: Good opacification of the central and segmental pulmonary arteries. No focal filling defects demonstrated. No evidence of significant pulmonary embolus. The central pulmonary arteries are dilated, suggesting arterial hypertension. Normal heart size. No pericardial effusion. Normal caliber thoracic aorta. No evidence of aortic dissection. Scattered aortic calcifications. Coronary artery calcifications. Mediastinum/Nodes: Esophagus is gas-filled but not abnormally distended. No esophageal wall thickening. No significant lymphadenopathy in the mediastinum. Prominent right axillary lymphadenopathy is indeterminate for metastatic disease. Lungs/Pleura: Ovoid spiculated mass with radiating septum to the pleura in the right lung base posteriorly. The lesion measures about 1.3 x 3.8 cm diameter. Appearance is similar to previous study and is again worrisome for malignancy. No additional focal nodules demonstrated in the lungs. No consolidation or edema. No blunting of costophrenic angles. No pneumothorax. Upper Abdomen: Heterogeneous nodular infiltration throughout the liver consistent with diffuse hepatic metastasis. Mild enlargement of right cardiophrenic angle lymph node may indicate metastatic disease as well. Musculoskeletal: Degenerative changes throughout the spine. No destructive bone lesions. Old rib fractures. Review of the MIP images confirms the above findings. IMPRESSION: 1. No evidence of significant pulmonary embolus. 2. Dilated central pulmonary arteries suggesting arterial hypertension. 3. Prominent lymph nodes in the right axilla and right cardiophrenic angle,  indeterminate for metastatic disease. 4. Ovoid spiculated mass in the right lung base posteriorly suspicious for neoplasm. No change since prior study. 5. Diffuse hepatic metastases. Electronically Signed   By: Lucienne Capers M.D.   On: 03/22/2017 21:15    Procedures Procedures (including critical care time)  Medications Ordered in ED Medications  magic mouthwash (10 mLs Oral Given 03/22/17 2047)  iopamidol (ISOVUE-370) 76 % injection (100 mLs Intravenous Contrast Given 03/22/17 2040)     Initial Impression / Assessment  and Plan / ED Course  I have reviewed the triage vital signs and the nursing notes.  Pertinent labs & imaging results that were available during my care of the patient were reviewed by me and considered in my medical decision making (see chart for details).  Clinical Course as of Mar 23 2143  Thu Mar 22, 2017  2016 High, recent Neulasta infusion WBC: (!) 21.1 [EW]  2016 Low , increased from recent baseline Hemoglobin: (!) 10.5 [EW]  2016 Low  Sodium: (!) 125 [EW]  2016 High  Potassium: (!) 5.2 [EW]  2016 Low  Chloride: (!) 85 [EW]  2016 High  BUN: (!) 34 [EW]  2017 Mildly elevated  Anion gap: (!) 16 [EW]  2017 High  BP: (!) 169/99 [EW]  2017 Low  Temp: (!) 97.5 F (36.4 C) [EW]  2021 Normal Creatinine: 0.99 [EW]  2142 Normal INR: 1.31 [EW]    Clinical Course User Index [EW] Daleen Bo, MD     Patient Vitals for the past 24 hrs:  BP Temp Temp src Pulse Resp SpO2 Height Weight  03/22/17 2109 (!) 197/83 - - 94 18 94 % - -  03/22/17 1941 - - - - - 96 % - -  03/22/17 1833 (!) 169/99 (!) 97.5 F (36.4 C) Oral 84 12 93 % 5' (1.524 m) 82.6 kg (182 lb)    9:36 PM Reevaluation with update and discussion. After initial assessment and treatment, an updated evaluation reveals she is more comfortable at this time and has no further complaints.  Findings discussed with the patient and all questions answered. Keaghan Bowens L     Final Clinical Impressions(s) /  ED Diagnoses   Final diagnoses:  SOB (shortness of breath)  Mouth ulcer  Vaginal discharge  Hypertension, unspecified type    Nonspecific shortness of breath, evaluation in the ED is reassuring.  Doubt ACS, PE or pneumonia.  Nonspecific vaginal discharge, patient declined evaluation in the ED, and this can be managed as an outpatient.  Doubt metabolic instability or impending vascular collapse.  Nursing Notes Reviewed/ Care Coordinated Applicable Imaging Reviewed Interpretation of Laboratory Data incorporated into ED treatment  The patient appears reasonably screened and/or stabilized for discharge and I doubt any other medical condition or other [facility] requiring further screening, evaluation, or treatment in the ED at this time prior to discharge.  Plan: Home Medications-continue current medications; Home Treatments-rest, fluids; return here if the recommended treatment, does not improve the symptoms; Recommended follow up-oncology PCP as needed   New Prescriptions New Prescriptions   MAGIC MOUTHWASH SOLN    Take 5 mLs by mouth 4 (four) times daily as needed for mouth pain.     Daleen Bo, MD 03/22/17 [phone]

## 2017-03-25 ENCOUNTER — Emergency Department (HOSPITAL_COMMUNITY): Payer: Medicaid Other

## 2017-03-25 ENCOUNTER — Inpatient Hospital Stay (HOSPITAL_COMMUNITY): Payer: Medicaid Other

## 2017-03-25 ENCOUNTER — Encounter (HOSPITAL_COMMUNITY): Payer: Self-pay | Admitting: Nurse Practitioner

## 2017-03-25 ENCOUNTER — Inpatient Hospital Stay (HOSPITAL_COMMUNITY)
Admission: EM | Admit: 2017-03-25 | Discharge: 2017-04-05 | DRG: 871 | Disposition: E | Payer: Medicaid Other | Attending: Internal Medicine | Admitting: Internal Medicine

## 2017-03-25 ENCOUNTER — Other Ambulatory Visit: Payer: Self-pay

## 2017-03-25 DIAGNOSIS — Y95 Nosocomial condition: Secondary | ICD-10-CM | POA: Diagnosis present

## 2017-03-25 DIAGNOSIS — R188 Other ascites: Secondary | ICD-10-CM | POA: Diagnosis present

## 2017-03-25 DIAGNOSIS — Z87891 Personal history of nicotine dependence: Secondary | ICD-10-CM | POA: Diagnosis not present

## 2017-03-25 DIAGNOSIS — J9690 Respiratory failure, unspecified, unspecified whether with hypoxia or hypercapnia: Secondary | ICD-10-CM

## 2017-03-25 DIAGNOSIS — J189 Pneumonia, unspecified organism: Secondary | ICD-10-CM | POA: Diagnosis present

## 2017-03-25 DIAGNOSIS — K5530 Necrotizing enterocolitis, unspecified: Secondary | ICD-10-CM | POA: Diagnosis present

## 2017-03-25 DIAGNOSIS — D701 Agranulocytosis secondary to cancer chemotherapy: Secondary | ICD-10-CM | POA: Diagnosis present

## 2017-03-25 DIAGNOSIS — D61810 Antineoplastic chemotherapy induced pancytopenia: Secondary | ICD-10-CM | POA: Diagnosis present

## 2017-03-25 DIAGNOSIS — Z452 Encounter for adjustment and management of vascular access device: Secondary | ICD-10-CM

## 2017-03-25 DIAGNOSIS — E872 Acidosis: Secondary | ICD-10-CM | POA: Diagnosis present

## 2017-03-25 DIAGNOSIS — C3491 Malignant neoplasm of unspecified part of right bronchus or lung: Secondary | ICD-10-CM | POA: Diagnosis present

## 2017-03-25 DIAGNOSIS — Z801 Family history of malignant neoplasm of trachea, bronchus and lung: Secondary | ICD-10-CM | POA: Diagnosis not present

## 2017-03-25 DIAGNOSIS — R509 Fever, unspecified: Secondary | ICD-10-CM | POA: Diagnosis present

## 2017-03-25 DIAGNOSIS — Z825 Family history of asthma and other chronic lower respiratory diseases: Secondary | ICD-10-CM | POA: Diagnosis not present

## 2017-03-25 DIAGNOSIS — J9601 Acute respiratory failure with hypoxia: Secondary | ICD-10-CM | POA: Diagnosis present

## 2017-03-25 DIAGNOSIS — Z8541 Personal history of malignant neoplasm of cervix uteri: Secondary | ICD-10-CM

## 2017-03-25 DIAGNOSIS — C787 Secondary malignant neoplasm of liver and intrahepatic bile duct: Secondary | ICD-10-CM | POA: Diagnosis present

## 2017-03-25 DIAGNOSIS — N179 Acute kidney failure, unspecified: Secondary | ICD-10-CM

## 2017-03-25 DIAGNOSIS — Z6839 Body mass index (BMI) 39.0-39.9, adult: Secondary | ICD-10-CM

## 2017-03-25 DIAGNOSIS — K8020 Calculus of gallbladder without cholecystitis without obstruction: Secondary | ICD-10-CM | POA: Diagnosis present

## 2017-03-25 DIAGNOSIS — A419 Sepsis, unspecified organism: Secondary | ICD-10-CM

## 2017-03-25 DIAGNOSIS — I4891 Unspecified atrial fibrillation: Secondary | ICD-10-CM | POA: Diagnosis not present

## 2017-03-25 DIAGNOSIS — R5081 Fever presenting with conditions classified elsewhere: Secondary | ICD-10-CM | POA: Diagnosis present

## 2017-03-25 DIAGNOSIS — I110 Hypertensive heart disease with heart failure: Secondary | ICD-10-CM | POA: Diagnosis present

## 2017-03-25 DIAGNOSIS — R6521 Severe sepsis with septic shock: Secondary | ICD-10-CM | POA: Diagnosis present

## 2017-03-25 DIAGNOSIS — E875 Hyperkalemia: Secondary | ICD-10-CM | POA: Diagnosis not present

## 2017-03-25 DIAGNOSIS — B961 Klebsiella pneumoniae [K. pneumoniae] as the cause of diseases classified elsewhere: Secondary | ICD-10-CM | POA: Diagnosis present

## 2017-03-25 DIAGNOSIS — R791 Abnormal coagulation profile: Secondary | ICD-10-CM | POA: Diagnosis present

## 2017-03-25 DIAGNOSIS — E871 Hypo-osmolality and hyponatremia: Secondary | ICD-10-CM | POA: Diagnosis present

## 2017-03-25 DIAGNOSIS — T451X5A Adverse effect of antineoplastic and immunosuppressive drugs, initial encounter: Secondary | ICD-10-CM | POA: Diagnosis present

## 2017-03-25 DIAGNOSIS — Z4659 Encounter for fitting and adjustment of other gastrointestinal appliance and device: Secondary | ICD-10-CM

## 2017-03-25 DIAGNOSIS — R0600 Dyspnea, unspecified: Secondary | ICD-10-CM

## 2017-03-25 DIAGNOSIS — D709 Neutropenia, unspecified: Secondary | ICD-10-CM | POA: Diagnosis present

## 2017-03-25 DIAGNOSIS — I509 Heart failure, unspecified: Secondary | ICD-10-CM | POA: Diagnosis present

## 2017-03-25 DIAGNOSIS — A4151 Sepsis due to Escherichia coli [E. coli]: Secondary | ICD-10-CM | POA: Diagnosis present

## 2017-03-25 DIAGNOSIS — G9340 Encephalopathy, unspecified: Secondary | ICD-10-CM

## 2017-03-25 DIAGNOSIS — Z66 Do not resuscitate: Secondary | ICD-10-CM | POA: Diagnosis present

## 2017-03-25 DIAGNOSIS — Z9911 Dependence on respirator [ventilator] status: Secondary | ICD-10-CM

## 2017-03-25 DIAGNOSIS — D708 Other neutropenia: Secondary | ICD-10-CM

## 2017-03-25 DIAGNOSIS — Z515 Encounter for palliative care: Secondary | ICD-10-CM | POA: Diagnosis present

## 2017-03-25 LAB — COMPREHENSIVE METABOLIC PANEL
ALK PHOS: 210 U/L — AB (ref 38–126)
ALT: 73 U/L — AB (ref 14–54)
AST: 175 U/L — ABNORMAL HIGH (ref 15–41)
Albumin: 2.2 g/dL — ABNORMAL LOW (ref 3.5–5.0)
Anion gap: 20 — ABNORMAL HIGH (ref 5–15)
BILIRUBIN TOTAL: 3.6 mg/dL — AB (ref 0.3–1.2)
BUN: 47 mg/dL — ABNORMAL HIGH (ref 6–20)
CALCIUM: 7.8 mg/dL — AB (ref 8.9–10.3)
CO2: 23 mmol/L (ref 22–32)
CREATININE: 1.63 mg/dL — AB (ref 0.44–1.00)
Chloride: 83 mmol/L — ABNORMAL LOW (ref 101–111)
GFR calc non Af Amer: 35 mL/min — ABNORMAL LOW (ref >60)
GFR, EST AFRICAN AMERICAN: 41 mL/min — AB (ref >60)
GLUCOSE: 59 mg/dL — AB (ref 65–99)
Potassium: 4.8 mmol/L (ref 3.5–5.1)
SODIUM: 126 mmol/L — AB (ref 135–145)
TOTAL PROTEIN: 6 g/dL — AB (ref 6.5–8.1)

## 2017-03-25 LAB — CBC WITH DIFFERENTIAL/PLATELET
BASOS PCT: 0 %
Basophils Absolute: 0 10*3/uL (ref 0.0–0.1)
EOS ABS: 0 10*3/uL (ref 0.0–0.7)
EOS PCT: 0 %
HEMATOCRIT: 30.8 % — AB (ref 36.0–46.0)
HEMOGLOBIN: 10.3 g/dL — AB (ref 12.0–15.0)
LYMPHS PCT: 53 %
Lymphs Abs: 0.1 10*3/uL — ABNORMAL LOW (ref 0.7–4.0)
MCH: 25.8 pg — AB (ref 26.0–34.0)
MCHC: 33.4 g/dL (ref 30.0–36.0)
MCV: 77 fL — ABNORMAL LOW (ref 78.0–100.0)
Monocytes Absolute: 0 10*3/uL — ABNORMAL LOW (ref 0.1–1.0)
Monocytes Relative: 5 %
NEUTROS PCT: 42 %
Neutro Abs: 0.1 10*3/uL — ABNORMAL LOW (ref 1.7–7.7)
Platelets: 73 10*3/uL — ABNORMAL LOW (ref 150–400)
RBC: 4 MIL/uL (ref 3.87–5.11)
RDW: 16.7 % — ABNORMAL HIGH (ref 11.5–15.5)
WBC: 0.2 10*3/uL — AB (ref 4.0–10.5)

## 2017-03-25 LAB — I-STAT TROPONIN, ED: Troponin i, poc: 0.01 ng/mL (ref 0.00–0.08)

## 2017-03-25 LAB — I-STAT CG4 LACTIC ACID, ED
Lactic Acid, Venous: 7.34 mmol/L (ref 0.5–1.9)
Lactic Acid, Venous: 6.72 mmol/L (ref 0.5–1.9)

## 2017-03-25 LAB — BRAIN NATRIURETIC PEPTIDE: B Natriuretic Peptide: 242.7 pg/mL — ABNORMAL HIGH (ref 0.0–100.0)

## 2017-03-25 MED ORDER — SODIUM CHLORIDE 0.9 % IV BOLUS (SEPSIS): 1000.0000 mL | Freq: Once | INTRAVENOUS | Status: DC

## 2017-03-25 MED ORDER — DEXTROSE 5 % IV SOLN
2.0000 g | Freq: Once | INTRAVENOUS | Status: AC
  Administered 2017-03-25: 2 g via INTRAVENOUS
  Filled 2017-03-25: qty 2

## 2017-03-25 MED ORDER — IPRATROPIUM-ALBUTEROL 0.5-2.5 (3) MG/3ML IN SOLN
3.0000 mL | Freq: Once | RESPIRATORY_TRACT | Status: AC
  Administered 2017-03-25: 3 mL via RESPIRATORY_TRACT
  Filled 2017-03-25: qty 3

## 2017-03-25 MED ORDER — SUCCINYLCHOLINE CHLORIDE 20 MG/ML IJ SOLN
125.0000 mg | Freq: Once | INTRAMUSCULAR | Status: DC
  Filled 2017-03-25: qty 6.25

## 2017-03-25 MED ORDER — DEXTROSE 5 % IV SOLN
2.0000 g | Freq: Three times a day (TID) | INTRAVENOUS | Status: DC
  Administered 2017-03-26: 2 g via INTRAVENOUS
  Filled 2017-03-25 (×2): qty 2

## 2017-03-25 MED ORDER — SODIUM CHLORIDE 0.9 % IV SOLN: 250.0000 mL | INTRAVENOUS | Status: DC | PRN

## 2017-03-25 MED ORDER — SODIUM CHLORIDE 0.9 % IV BOLUS (SEPSIS)
1000.0000 mL | Freq: Once | INTRAVENOUS | Status: AC
  Administered 2017-03-25: 1000 mL via INTRAVENOUS

## 2017-03-25 MED ORDER — SODIUM CHLORIDE 0.9 % IV SOLN
25.0000 ug/h | INTRAVENOUS | Status: DC
  Administered 2017-03-25: 50 ug/h via INTRAVENOUS
  Filled 2017-03-25: qty 50

## 2017-03-25 MED ORDER — HEPARIN SODIUM (PORCINE) 5000 UNIT/ML IJ SOLN
5000.0000 [IU] | Freq: Three times a day (TID) | INTRAMUSCULAR | Status: DC
  Administered 2017-03-26 (×2): 5000 [IU] via SUBCUTANEOUS
  Filled 2017-03-25 (×2): qty 1

## 2017-03-25 MED ORDER — FENTANYL CITRATE (PF) 100 MCG/2ML IJ SOLN: 50.0000 ug | Freq: Once | INTRAMUSCULAR | Status: DC

## 2017-03-25 MED ORDER — VANCOMYCIN HCL IN DEXTROSE 1-5 GM/200ML-% IV SOLN
1000.0000 mg | Freq: Once | INTRAVENOUS | Status: AC
  Administered 2017-03-25: 1000 mg via INTRAVENOUS
  Filled 2017-03-25: qty 200

## 2017-03-25 MED ORDER — MIDAZOLAM HCL 2 MG/2ML IJ SOLN
2.0000 mg | INTRAMUSCULAR | Status: AC | PRN
  Administered 2017-03-25 – 2017-03-27 (×3): 2 mg via INTRAVENOUS
  Filled 2017-03-25 (×3): qty 2

## 2017-03-25 MED ORDER — KETAMINE HCL 10 MG/ML IJ SOLN
INTRAMUSCULAR | Status: AC | PRN
  Administered 2017-03-25: 85 mg via INTRAVENOUS

## 2017-03-25 MED ORDER — HYDROCORTISONE NA SUCCINATE PF 100 MG IJ SOLR
100.0000 mg | Freq: Once | INTRAMUSCULAR | Status: AC
  Administered 2017-03-25: 100 mg via INTRAVENOUS
  Filled 2017-03-25: qty 2

## 2017-03-25 MED ORDER — FENTANYL BOLUS VIA INFUSION
50.0000 ug | INTRAVENOUS | Status: DC | PRN
  Administered 2017-03-25 – 2017-03-27 (×6): 50 ug via INTRAVENOUS
  Filled 2017-03-25: qty 50

## 2017-03-25 MED ORDER — SUCCINYLCHOLINE CHLORIDE 20 MG/ML IJ SOLN
INTRAMUSCULAR | Status: AC | PRN
  Administered 2017-03-25: 125 mg via INTRAVENOUS

## 2017-03-25 MED ORDER — IPRATROPIUM-ALBUTEROL 0.5-2.5 (3) MG/3ML IN SOLN
3.0000 mL | Freq: Four times a day (QID) | RESPIRATORY_TRACT | Status: DC
  Administered 2017-03-26 – 2017-03-27 (×7): 3 mL via RESPIRATORY_TRACT
  Filled 2017-03-25 (×8): qty 3

## 2017-03-25 MED ORDER — SODIUM CHLORIDE 0.9 % IV SOLN
80.0000 mg | Freq: Once | INTRAVENOUS | Status: AC
  Administered 2017-03-25: 80 mg via INTRAVENOUS
  Filled 2017-03-25: qty 80

## 2017-03-25 MED ORDER — KETAMINE HCL-SODIUM CHLORIDE 100-0.9 MG/10ML-% IV SOSY
1.0000 mg/kg | PREFILLED_SYRINGE | Freq: Once | INTRAVENOUS | Status: AC
  Administered 2017-03-25: 85 mg via INTRAVENOUS
  Filled 2017-03-25: qty 10

## 2017-03-25 MED ORDER — SODIUM CHLORIDE 0.9 % IV BOLUS (SEPSIS)
500.0000 mL | Freq: Once | INTRAVENOUS | Status: AC
  Administered 2017-03-25: 500 mL via INTRAVENOUS

## 2017-03-25 MED ORDER — HYDROCORTISONE NA SUCCINATE PF 100 MG IJ SOLR
100.0000 mg | Freq: Two times a day (BID) | INTRAMUSCULAR | Status: DC
  Filled 2017-03-25: qty 2

## 2017-03-25 MED ORDER — ORAL CARE MOUTH RINSE
15.0000 mL | OROMUCOSAL | Status: DC
  Administered 2017-03-26 – 2017-03-27 (×15): 15 mL via OROMUCOSAL

## 2017-03-25 MED ORDER — CHLORHEXIDINE GLUCONATE 0.12% ORAL RINSE (MEDLINE KIT)
15.0000 mL | Freq: Two times a day (BID) | OROMUCOSAL | Status: DC
  Administered 2017-03-26 – 2017-03-27 (×3): 15 mL via OROMUCOSAL

## 2017-03-25 MED ORDER — DEXTROSE 50 % IV SOLN
1.0000 | Freq: Once | INTRAVENOUS | Status: AC
  Administered 2017-03-25: 50 mL via INTRAVENOUS
  Filled 2017-03-25: qty 50

## 2017-03-25 MED ORDER — MIDAZOLAM HCL 2 MG/2ML IJ SOLN
2.0000 mg | INTRAMUSCULAR | Status: DC | PRN
  Administered 2017-03-26 – 2017-03-27 (×2): 2 mg via INTRAVENOUS
  Filled 2017-03-25 (×3): qty 2

## 2017-03-25 MED ORDER — NOREPINEPHRINE BITARTRATE 1 MG/ML IV SOLN
0.0000 ug/min | Freq: Once | INTRAVENOUS | Status: AC
  Administered 2017-03-26: 10 ug/min via INTRAVENOUS
  Filled 2017-03-25 (×3): qty 4

## 2017-03-25 MED ORDER — VANCOMYCIN HCL IN DEXTROSE 1-5 GM/200ML-% IV SOLN
1000.0000 mg | INTRAVENOUS | Status: DC
  Filled 2017-03-25: qty 200

## 2017-03-25 MED ORDER — PANTOPRAZOLE SODIUM 40 MG IV SOLR
40.0000 mg | Freq: Every day | INTRAVENOUS | Status: DC
  Administered 2017-03-26 – 2017-03-27 (×2): 40 mg via INTRAVENOUS
  Filled 2017-03-25 (×2): qty 40

## 2017-03-25 NOTE — ED Provider Notes (Signed)
[facility] DEPT Provider Note   CSN: [id number] Arrival date & time: 03/17/2017  1723     History   Chief Complaint Chief Complaint  Patient presents with  . Shortness of Breath    HPI Marisa Kim is a 51 y.o. female.  HPI   51 year old female with past medical history as below who presents with cough and shortness of breath.  The patient was just seen 2 days ago for cough and associated chest pain.  She had no fever at that time.  Lab work and CT imaging was largely unremarkable and she was sent home.  She states that since then, she has had progressively worsening cough and shortness of breath.  She is producing yellow-green sputum.  No bloody sputum.  She has had associated chills and fatigue.  She has not been able to eat or drink due to her shortness of breath.  She has had progressive worsening generalized fatigue associated with this.  Remainder of history limited due to patient's severe shortness of breath.  Level 5 caveat invoked as remainder of history, ROS, and physical exam limited due to patient's dyspnea, acute illness.   Past Medical History:  Diagnosis Date  . Cervical ca ([place])   . Pulmonary stenosis     Patient Active Problem List   Diagnosis Date Noted  . Neutropenic fever ([place]) 03/30/2017  . Counseling regarding advanced care planning and goals of care 03/13/2017  . Metastatic carcinoma ([place]) 03/08/2017  . Abdominal pain   . Liver lesion   . Liver mass   . Hypotension 03/03/2017  . AKI (acute kidney injury) ([place]) 03/03/2017  . LFT elevation 03/03/2017  . Leucocytosis 03/03/2017  . Pyuria 03/03/2017  . Lactic acid acidosis 03/03/2017  . Hyponatremia 03/03/2017  . Liver metastases ([place]) 03/03/2017  . Cervical cancer ([place]) 03/03/2017  . Right lower lobe lung mass 03/03/2017  . Cholelithiasis 03/03/2017  . RUQ pain 03/03/2017    Past Surgical History:  Procedure Laterality Date  . CARDIAC SURGERY    . CERVIX  SURGERY     30 years ago    OB History    No data available       Home Medications    Prior to Admission medications   Medication Sig Start Date End Date Taking? Authorizing Provider  dexamethasone (DECADRON) 4 MG tablet Take 2 tablets (8 mg total) by mouth daily. Start the day after chemotherapy for 2 days. 03/13/17  Yes Brunetta Genera, MD  LORazepam (ATIVAN) 0.5 MG tablet Take 1 tablet (0.5 mg total) by mouth every 6 (six) hours as needed (Nausea or vomiting). 03/13/17  Yes Brunetta Genera, MD  morphine (MS CONTIN) 60 MG 12 hr tablet Take 1 tablet (60 mg total) by mouth every 12 (twelve) hours. 03/12/17  Yes Brunetta Genera, MD  ondansetron (ZOFRAN) 8 MG tablet Take 1 tablet (8 mg total) by mouth 2 (two) times daily as needed for refractory nausea / vomiting. Start on day 3 after chemo. 03/13/17  Yes Brunetta Genera, MD  prochlorperazine (COMPAZINE) 10 MG tablet Take 1 tablet (10 mg total) by mouth every 6 (six) hours as needed (Nausea or vomiting). 03/13/17  Yes Brunetta Genera, MD  oxyCODONE 10 MG TABS Take 1 tablet (10 mg total) by mouth every 4 (four) hours as needed for moderate pain. 03/12/17   Brunetta Genera, MD    Family History Family History  Problem Relation Age of Onset  . Lung cancer  Mother 72  . Emphysema Father     Social History Social History  Substance Use Topics  . Smoking status: Former Smoker    Packs/day: 2.00    Years: 30.00    Quit date: 03/03/2010  . Smokeless tobacco: Never Used  . Alcohol use No     Allergies   Patient has no known allergies.   Review of Systems Review of Systems  Constitutional: Positive for chills, fatigue and fever.  Respiratory: Positive for cough and shortness of breath.   Neurological: Positive for weakness.  All other systems reviewed and are negative.    Physical Exam Updated Vital Signs BP (!) 153/79   Pulse (!) 103   Temp (!) 97.1 F (36.2 C) (Axillary)   Resp 18   Ht 5'  (1.524 m)   Wt 82.6 kg (182 lb)   SpO2 94%   BMI 35.54 kg/m   Physical Exam  Constitutional: She is oriented to person, place, and time. She appears well-developed and well-nourished. She has a sickly appearance. She appears distressed.  HENT:  Head: Normocephalic and atraumatic.  Eyes: Conjunctivae are normal.  Neck: Neck supple.  Cardiovascular: Normal rate, regular rhythm and normal heart sounds.  Exam reveals no friction rub.   No murmur heard. Pulmonary/Chest: Effort normal. Tachypnea noted. No respiratory distress. She has decreased breath sounds. She has no wheezes. She has rhonchi in the right middle field, the right lower field and the left lower field. She has no rales.  Abdominal: Soft. She exhibits distension. There is no tenderness. There is no rebound and no guarding.  Musculoskeletal: She exhibits no edema.  Neurological: She is alert and oriented to person, place, and time. She exhibits normal muscle tone.  Skin: Skin is warm. Capillary refill takes less than 2 seconds.  Psychiatric: She has a normal mood and affect.  Nursing note and vitals reviewed.    ED Treatments / Results  Labs (all labs ordered are listed, but only abnormal results are displayed) Labs Reviewed  CBC WITH DIFFERENTIAL/PLATELET - Abnormal; Notable for the following:       Result Value   WBC 0.2 (*)    Hemoglobin 10.3 (*)    HCT 30.8 (*)    MCV 77.0 (*)    MCH 25.8 (*)    RDW 16.7 (*)    Platelets 73 (*)    Neutro Abs 0.1 (*)    Lymphs Abs 0.1 (*)    Monocytes Absolute 0.0 (*)    All other components within normal limits  COMPREHENSIVE METABOLIC PANEL - Abnormal; Notable for the following:    Sodium 126 (*)    Chloride 83 (*)    Glucose, Bld 59 (*)    BUN 47 (*)    Creatinine, Ser 1.63 (*)    Calcium 7.8 (*)    Total Protein 6.0 (*)    Albumin 2.2 (*)    AST 175 (*)    ALT 73 (*)    Alkaline Phosphatase 210 (*)    Total Bilirubin 3.6 (*)    GFR calc non Af Amer 35 (*)    GFR  calc Af Amer 41 (*)    Anion gap 20 (*)    All other components within normal limits  BRAIN NATRIURETIC PEPTIDE - Abnormal; Notable for the following:    B Natriuretic Peptide 242.7 (*)    All other components within normal limits  I-STAT CG4 LACTIC ACID, ED - Abnormal; Notable for the following:    Lactic  Acid, Venous 7.34 (*)    All other components within normal limits  I-STAT CG4 LACTIC ACID, ED - Abnormal; Notable for the following:    Lactic Acid, Venous 6.72 (*)    All other components within normal limits  CULTURE, BLOOD (ROUTINE X 2)  CULTURE, BLOOD (ROUTINE X 2)  URINE CULTURE  URINALYSIS, COMPLETE (UACMP) WITH MICROSCOPIC  CBC WITH DIFFERENTIAL/PLATELET  COMPREHENSIVE METABOLIC PANEL  MAGNESIUM  PHOSPHORUS  TROPONIN I  BRAIN NATRIURETIC PEPTIDE  LACTIC ACID, PLASMA  SODIUM, URINE, RANDOM  AMYLASE  LIPASE, BLOOD  BLOOD GAS, ARTERIAL  I-STAT TROPONIN, ED    EKG  EKG Interpretation  Date/Time:  Sunday March 25 2017 18:05:31 EDT Ventricular Rate:  91 PR Interval:    QRS Duration: 82 QT Interval:  404 QTC Calculation: 498 R Axis:   86 Text Interpretation:  Sinus rhythm Probable left atrial enlargement No significant change since last tracing Confirmed by Duffy Bruce [phone]) on 03/26/2017 12:02:58 AM       Radiology Ct Abdomen Pelvis Wo Contrast  Result Date: 03/07/2017 CLINICAL DATA:  Acute onset of neutropenia and generalized abdominal distention. Current history of metastatic cancer to the liver. Initial encounter. EXAM: CT ABDOMEN AND PELVIS WITHOUT CONTRAST TECHNIQUE: Multidetector CT imaging of the abdomen and pelvis was performed following the standard protocol without IV contrast. COMPARISON:  CT of the abdomen and pelvis, and MRI of the abdomen, performed 03/03/2017 FINDINGS: Lower chest: Patchy bibasilar airspace opacities raise concern for multifocal pneumonia. The heart is borderline normal in size. The enteric tube is noted ending at the body  of the stomach. Hepatobiliary: Innumerable metastases are noted throughout the liver, with trace surrounding ascites. Stones are noted within the gallbladder. The gallbladder is otherwise unremarkable. Pancreas: The pancreas is within normal limits. Spleen: The spleen is unremarkable in appearance. Adrenals/Urinary Tract: Nonspecific stranding is noted about the adrenal glands. The adrenal glands are otherwise unremarkable. Mild nonspecific perinephric stranding is noted bilaterally. The kidneys are otherwise unremarkable. There is no evidence of hydronephrosis. No renal or ureteral stones are identified. Stomach/Bowel: The stomach is unremarkable in appearance. The small bowel is within normal limits. The appendix is normal in caliber, without evidence of appendicitis. There is fatty infiltration within the wall of the cecum and ascending colon, reflecting chronic sequelae of inflammation. Mild wall thickening is noted along the distal transverse and descending colon, raising question for a mild infectious or inflammatory process. Vascular/Lymphatic: Scattered calcification is seen along the abdominal aorta and its branches. The abdominal aorta is otherwise grossly unremarkable. The inferior vena cava is grossly unremarkable. No retroperitoneal lymphadenopathy is seen. No pelvic sidewall lymphadenopathy is identified. Reproductive: The bladder is mildly distended and grossly unremarkable. The uterus is unremarkable in appearance. The ovaries are relatively symmetric. No suspicious adnexal masses are seen. Other: Soft tissue edema is noted along the abdominal and pelvic wall, raising question for mild anasarca. A 3.5 cm prominent subcutaneous soft tissue nodule is noted overlying the right gluteus musculature. Musculoskeletal: No acute osseous abnormalities are identified. Facet disease is noted at the lower lumbar spine. The visualized musculature is unremarkable in appearance. IMPRESSION: 1. Patchy bibasilar  airspace opacities raise concern for multifocal pneumonia. 2. Mild wall thickening along the distal transverse and descending colon raises question for a mild infectious or inflammatory process. 3. Innumerable metastases again noted throughout the liver, with trace surrounding ascites. 4. Soft tissue edema along the abdominal and pelvic wall raises question for mild anasarca. 5. 3.5 cm prominent subcutaneous soft  tissue nodule again noted overlying the right gluteus musculature. 6. Cholelithiasis. 7. Scattered aortic atherosclerosis. Electronically Signed   By: Garald Balding M.D.   On: 03/07/2017 23:57   Portable Chest Xray  Result Date: 03/19/2017 CLINICAL DATA:  Endotracheal tube and nasogastric tube placement. Initial encounter. EXAM: PORTABLE CHEST 1 VIEW COMPARISON:  Chest radiograph performed earlier today at 6:03 p.m. FINDINGS: The patient's endotracheal tube is seen ending 1-2 cm above the carina. This could be retracted 1-2 cm. An enteric tube is noted extending below the diaphragm. Retrocardiac airspace opacity raises concern for pneumonia. No pleural effusion or pneumothorax is seen. The cardiomediastinal silhouette is borderline normal in size. No acute osseous abnormalities are identified. IMPRESSION: 1. Endotracheal tube seen ending 1-2 cm above the carina. This could be retracted 1-2 cm. 2. Enteric tube noted extending below the diaphragm. 3. Retrocardiac airspace opacity raises concern for pneumonia. Electronically Signed   By: Garald Balding M.D.   On: 03/29/2017 23:42   Dg Chest Port 1 View  Result Date: 04/03/2017 CLINICAL DATA:  Short of breath EXAM: PORTABLE CHEST 1 VIEW COMPARISON:  CT chest 03/22/2014 FINDINGS: New area of left lower lobe airspace disease, consistent with pneumonia. No significant effusion. Negative for heart failure. Right lower lobe density seen on the prior study not well visualized today. IMPRESSION: Left lower lobe infiltrate, probable pneumonia Electronically  Signed   By: Franchot Gallo M.D.   On: 03/15/2017 18:59    Procedures .Critical Care Performed by: Duffy Bruce Authorized by: Duffy Bruce   Critical care provider statement:    Critical care time (minutes):  65   Critical care time was exclusive of:  Separately billable procedures and treating other patients and teaching time   Critical care was necessary to treat or prevent imminent or life-threatening deterioration of the following conditions:  Cardiac failure, circulatory failure, respiratory failure, sepsis and shock   Critical care was time spent personally by me on the following activities:  Development of treatment plan with patient or surrogate, discussions with consultants, evaluation of patient's response to treatment, examination of patient, obtaining history from patient or surrogate, ordering and performing treatments and interventions, ordering and review of laboratory studies, ordering and review of radiographic studies, pulse oximetry, re-evaluation of patient's condition, review of old charts and ventilator management   I assumed direction of critical care for this patient from another provider in my specialty: no   Procedure Name: Intubation Date/Time: 03/15/2017 11:55 PM Performed by: Duffy Bruce Pre-anesthesia Checklist: Patient identified, Patient being monitored, Emergency Drugs available, Timeout performed and Suction available Oxygen Delivery Method: Non-rebreather mask Preoxygenation: Pre-oxygenation with 100% oxygen Induction Type: Rapid sequence Ventilation: Mask ventilation without difficulty Laryngoscope Size: 3 and Glidescope Grade View: Grade I Tube size: 7.5 mm Number of attempts: 1 Airway Equipment and Method: Rigid stylet Placement Confirmation: ETT inserted through vocal cords under direct vision,  CO2 detector and Breath sounds checked- equal and bilateral Secured at: 22 cm Tube secured with: ETT holder Dental Injury: Teeth and Oropharynx  as per pre-operative assessment  Difficulty Due To: Difficulty was unanticipated Future Recommendations: Recommend- induction with short-acting agent, and alternative techniques readily available      (including critical care time)  Medications Ordered in ED Medications  0.9 %  sodium chloride infusion (not administered)  heparin injection 5,000 Units (not administered)  ipratropium-albuterol (DUONEB) 0.5-2.5 (3) MG/3ML nebulizer solution 3 mL (3 mLs Nebulization Not Given 03/18/2017 2205)  ceFEPIme (MAXIPIME) 2 g in dextrose 5 % 50  mL IVPB (not administered)  vancomycin (VANCOCIN) IVPB 1000 mg/200 mL premix (not administered)  hydrocortisone sodium succinate (SOLU-CORTEF) 100 MG injection 100 mg (not administered)  norepinephrine (LEVOPHED) 4 mg in dextrose 5 % 250 mL (0.016 mg/mL) infusion (not administered)  succinylcholine (ANECTINE) injection 125 mg (not administered)  pantoprazole (PROTONIX) injection 40 mg (not administered)  chlorhexidine gluconate (MEDLINE KIT) (PERIDEX) 0.12 % solution 15 mL (not administered)  MEDLINE mouth rinse (not administered)  pantoprazole (PROTONIX) 80 mg in sodium chloride 0.9 % 100 mL IVPB (80 mg Intravenous New Bag/Given 03/11/2017 2344)  fentaNYL (SUBLIMAZE) 2,500 mcg in sodium chloride 0.9 % 250 mL (10 mcg/mL) infusion (50 mcg/hr Intravenous New Bag/Given 03/15/2017 2343)  fentaNYL (SUBLIMAZE) bolus via infusion 50 mcg (50 mcg Intravenous Bolus from Bag 03/16/2017 2345)  midazolam (VERSED) injection 2 mg (2 mg Intravenous Given 03/13/2017 2353)  midazolam (VERSED) injection 2 mg (not administered)  fentaNYL (SUBLIMAZE) injection 50 mcg (not administered)  sodium chloride 0.9 % bolus 500 mL (0 mLs Intravenous Stopped 03/06/2017 2007)  ceFEPIme (MAXIPIME) 2 g in dextrose 5 % 50 mL IVPB (0 g Intravenous Stopped 03/10/2017 1926)  vancomycin (VANCOCIN) IVPB 1000 mg/200 mL premix (0 mg Intravenous Stopped 03/07/2017 2053)  sodium chloride 0.9 % bolus 1,000 mL (0 mLs  Intravenous Stopped 04/04/2017 2054)  sodium chloride 0.9 % bolus 1,000 mL (0 mLs Intravenous Stopped 03/19/2017 2116)  hydrocortisone sodium succinate (SOLU-CORTEF) 100 MG injection 100 mg (100 mg Intravenous Given 03/06/2017 2009)  dextrose 50 % solution 50 mL (50 mLs Intravenous Given 03/21/2017 2009)  ipratropium-albuterol (DUONEB) 0.5-2.5 (3) MG/3ML nebulizer solution 3 mL (3 mLs Nebulization Given 03/07/2017 2205)  ketamine 100 mg in normal saline 10 mL (8m/mL) syringe (85 mg Intravenous Given 03/10/2017 2306)  ketamine (KETALAR) injection (85 mg Intravenous Given 03/15/2017 2306)  succinylcholine (ANECTINE) injection (125 mg Intravenous Given 03/22/2017 2306)     Initial Impression / Assessment and Plan / ED Course  I have reviewed the triage vital signs and the nursing notes.  Pertinent labs & imaging results that were available during my care of the patient were reviewed by me and considered in my medical decision making (see chart for details).     51year old female with history of metastatic ovarian and liver cancer who presents with shortness of breath, cough, and generalized fatigue.  On arrival, the patient is hypoxic, hypotensive, and mildly hypothermic.  Concern for severe sepsis secondary to likely pneumonia.  Patient just had a CT scan that did not show a pneumonia, but I am concerned based on my exam and history.  Portable chest x-ray confirms left lower lobe pneumonia.  Patient started on broad-spectrum antibiotics.  Will give 30 cc/kg.  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  She appears clinically become intravascularly depleted.  She is protecting her airway at this time but due to her work of breathing, she may ultimately need intubation.  Lab work pending.  Lab work shows profound, new neutropenia with decrease of white blood cell count from 21,002 days ago to 0.2.  Patient is also newly  thrombocytopenic.  Lab work shows worsening hyponatremia and AK I.  Will consult ICU for admission.  ICU has evaluated.  [facility]  While awaiting transfer, the patient has had persistent tachypnea.  I am concerned about her stability for transfer.  Discussed with family and patient.  Will intubate for airway protection and persistent hypoxia with increased work of breathing.  Patient intubated and tolerated well.  CT scan obtained due to abdominal distention.  I discussed to the CT findings with radiology to see no acute surgical abnormality.  They do confirm pneumonia.  Patient is hemodynamically improved after intubation.  Blood gas shows mild respiratory acidosis and respiratory rate was increased on the vent.  Note: ET tube right above carina, withdrawn 1 cm.   This note was prepared with assistance of Systems analyst. Occasional wrong-word or sound-a-like substitutions may have occurred due to the inherent limitations of voice recognition software.  Final Clinical Impressions(s) / ED Diagnoses   Final diagnoses:  HCAP (healthcare-associated pneumonia)  Septic shock ([place])  Acute respiratory failure with hypoxia ([place])  Other neutropenia ([place])  AKI (acute kidney injury) ([place])    New Prescriptions New Prescriptions   No medications on file     Duffy Bruce, MD 03/26/17 0003

## 2017-03-25 NOTE — ED Notes (Signed)
This RN accompanied pt to CT post intubation. Pt remained stable throughout.

## 2017-03-25 NOTE — ED Notes (Signed)
Patient placed on purewick catheter, awaiting urine specimen.

## 2017-03-25 NOTE — ED Notes (Signed)
Patient tried to use bedpan for stool and contaminated urine specimen with the stool.

## 2017-03-25 NOTE — ED Notes (Signed)
NEUTROPENIC PRECAUTIONS initiated at this time.

## 2017-03-25 NOTE — ED Notes (Signed)
Patient removed purewick catheter.

## 2017-03-25 NOTE — Progress Notes (Signed)
A consult was received from an ED physician for Vancomycin & Cefepime per pharmacy dosing.  The patient's profile has been reviewed for ht/wt/allergies/indication/available labs.   A one time order has been placed for Vancomycin 1gm, Cefepime 2gm.  Further antibiotics/pharmacy consults should be ordered by admitting physician if indicated.                       Thank you, Minda Ditto 04/01/2017  6:17 PM

## 2017-03-25 NOTE — ED Triage Notes (Signed)
Pt is an oncology pt c/o of severe shortness of breath and chest pain. Diffuse rales and rhonchi during this initial assessment, O2 SATS 98% on 4L nasal canula, last chemo date 7 days ago. Denies fever or chills.

## 2017-03-25 NOTE — Progress Notes (Signed)
Pt tolerated BIPAP for approximately 5 minutes before she wanted RT to remove mask.  Pt states that she feels like she is smothering.  Pt placed back on 3 LPM Joshua and pt is tolerating well at this time.  Pt appears to be in mild distress, RN Zenia Resides notified.  RT to monitor and assess as needed.

## 2017-03-26 ENCOUNTER — Inpatient Hospital Stay (HOSPITAL_COMMUNITY): Payer: Medicaid Other

## 2017-03-26 ENCOUNTER — Ambulatory Visit (HOSPITAL_COMMUNITY): Admission: RE | Admit: 2017-03-26 | Payer: Self-pay | Source: Ambulatory Visit

## 2017-03-26 ENCOUNTER — Other Ambulatory Visit: Payer: Self-pay

## 2017-03-26 ENCOUNTER — Telehealth: Payer: Self-pay

## 2017-03-26 ENCOUNTER — Encounter (HOSPITAL_COMMUNITY): Payer: Self-pay

## 2017-03-26 DIAGNOSIS — Z9911 Dependence on respirator [ventilator] status: Secondary | ICD-10-CM

## 2017-03-26 DIAGNOSIS — J9601 Acute respiratory failure with hypoxia: Secondary | ICD-10-CM

## 2017-03-26 DIAGNOSIS — A419 Sepsis, unspecified organism: Secondary | ICD-10-CM

## 2017-03-26 DIAGNOSIS — R6521 Severe sepsis with septic shock: Secondary | ICD-10-CM

## 2017-03-26 DIAGNOSIS — J189 Pneumonia, unspecified organism: Secondary | ICD-10-CM

## 2017-03-26 DIAGNOSIS — G9340 Encephalopathy, unspecified: Secondary | ICD-10-CM

## 2017-03-26 LAB — BASIC METABOLIC PANEL WITH GFR
Anion gap: 17 — ABNORMAL HIGH (ref 5–15)
BUN: 41 mg/dL — ABNORMAL HIGH (ref 6–20)
CO2: 17 mmol/L — ABNORMAL LOW (ref 22–32)
Calcium: 6.4 mg/dL — CL (ref 8.9–10.3)
Chloride: 91 mmol/L — ABNORMAL LOW (ref 101–111)
Creatinine, Ser: 1.74 mg/dL — ABNORMAL HIGH (ref 0.44–1.00)
GFR calc Af Amer: 38 mL/min — ABNORMAL LOW
GFR calc non Af Amer: 33 mL/min — ABNORMAL LOW
Glucose, Bld: 114 mg/dL — ABNORMAL HIGH (ref 65–99)
Potassium: 5.3 mmol/L — ABNORMAL HIGH (ref 3.5–5.1)
Sodium: 125 mmol/L — ABNORMAL LOW (ref 135–145)

## 2017-03-26 LAB — BLOOD GAS, ARTERIAL
Acid-base deficit: 9.1 mmol/L — ABNORMAL HIGH (ref 0.0–2.0)
Bicarbonate: 19.7 mmol/L — ABNORMAL LOW (ref 20.0–28.0)
Drawn by: 308601
FIO2: 50
MECHVT: 370 mL
O2 Saturation: 94.3 %
PEEP: 5 cmH2O
Patient temperature: 98.6
RATE: 16 resp/min
pCO2 arterial: 60.4 mmHg — ABNORMAL HIGH (ref 32.0–48.0)
pH, Arterial: 7.141 — CL (ref 7.350–7.450)
pO2, Arterial: 107 mmHg (ref 83.0–108.0)

## 2017-03-26 LAB — LACTIC ACID, PLASMA
Lactic Acid, Venous: 6 mmol/L (ref 0.5–1.9)
Lactic Acid, Venous: 6.4 mmol/L (ref 0.5–1.9)
Lactic Acid, Venous: 5.4 mmol/L (ref 0.5–1.9)
Lactic Acid, Venous: 4.8 mmol/L (ref 0.5–1.9)

## 2017-03-26 LAB — CBC WITH DIFFERENTIAL/PLATELET
HCT: 28.5 % — ABNORMAL LOW (ref 36.0–46.0)
Hemoglobin: 9.3 g/dL — ABNORMAL LOW (ref 12.0–15.0)
MCH: 25.6 pg — ABNORMAL LOW (ref 26.0–34.0)
MCHC: 32.6 g/dL (ref 30.0–36.0)
MCV: 78.5 fL (ref 78.0–100.0)
Platelets: 45 10*3/uL — ABNORMAL LOW (ref 150–400)
RBC: 3.63 MIL/uL — ABNORMAL LOW (ref 3.87–5.11)
RDW: 16.8 % — ABNORMAL HIGH (ref 11.5–15.5)
WBC: 0.3 10*3/uL — CL (ref 4.0–10.5)

## 2017-03-26 LAB — POCT I-STAT 3, ART BLOOD GAS (G3+)
Acid-base deficit: 8 mmol/L — ABNORMAL HIGH (ref 0.0–2.0)
Acid-base deficit: 8 mmol/L — ABNORMAL HIGH (ref 0.0–2.0)
Bicarbonate: 20.1 mmol/L (ref 20.0–28.0)
Bicarbonate: 18.4 mmol/L — ABNORMAL LOW (ref 20.0–28.0)
O2 SAT: 100 %
O2 SAT: 96 %
PCO2 ART: 49.6 mmHg — AB (ref 32.0–48.0)
PH ART: 7.217 — AB (ref 7.350–7.450)
PH ART: 7.286 — AB (ref 7.350–7.450)
Patient temperature: 98
TCO2: 22 mmol/L (ref 22–32)
TCO2: 20 mmol/L — ABNORMAL LOW (ref 22–32)
pCO2 arterial: 38.5 mmHg (ref 32.0–48.0)
pO2, Arterial: 503 mmHg — ABNORMAL HIGH (ref 83.0–108.0)
pO2, Arterial: 88 mmHg (ref 83.0–108.0)

## 2017-03-26 LAB — URINALYSIS, COMPLETE (UACMP) WITH MICROSCOPIC
Bilirubin Urine: NEGATIVE
GLUCOSE, UA: NEGATIVE mg/dL
KETONES UR: NEGATIVE mg/dL
LEUKOCYTES UA: NEGATIVE
NITRITE: NEGATIVE
PH: 5 (ref 5.0–8.0)
PROTEIN: 30 mg/dL — AB
Specific Gravity, Urine: 1.015 (ref 1.005–1.030)

## 2017-03-26 LAB — COMPREHENSIVE METABOLIC PANEL
ALT: 65 U/L — ABNORMAL HIGH (ref 14–54)
AST: 157 U/L — ABNORMAL HIGH (ref 15–41)
Albumin: 1.7 g/dL — ABNORMAL LOW (ref 3.5–5.0)
Alkaline Phosphatase: 150 U/L — ABNORMAL HIGH (ref 38–126)
Anion gap: 15 (ref 5–15)
BUN: 39 mg/dL — ABNORMAL HIGH (ref 6–20)
CO2: 17 mmol/L — ABNORMAL LOW (ref 22–32)
Calcium: 6.6 mg/dL — ABNORMAL LOW (ref 8.9–10.3)
Chloride: 90 mmol/L — ABNORMAL LOW (ref 101–111)
Creatinine, Ser: 1.53 mg/dL — ABNORMAL HIGH (ref 0.44–1.00)
GFR calc Af Amer: 44 mL/min — ABNORMAL LOW (ref >60)
GFR calc non Af Amer: 38 mL/min — ABNORMAL LOW (ref >60)
Glucose, Bld: 74 mg/dL (ref 65–99)
Potassium: 5 mmol/L (ref 3.5–5.1)
Sodium: 122 mmol/L — ABNORMAL LOW (ref 135–145)
Total Bilirubin: 3.2 mg/dL — ABNORMAL HIGH (ref 0.3–1.2)
Total Protein: 5.2 g/dL — ABNORMAL LOW (ref 6.5–8.1)

## 2017-03-26 LAB — LIPASE, BLOOD: Lipase: 30 U/L (ref 11–51)

## 2017-03-26 LAB — BLOOD CULTURE ID PANEL (REFLEXED)
ACINETOBACTER BAUMANNII: NOT DETECTED
CANDIDA ALBICANS: NOT DETECTED
CANDIDA GLABRATA: NOT DETECTED
CANDIDA PARAPSILOSIS: NOT DETECTED
CANDIDA TROPICALIS: NOT DETECTED
Candida krusei: NOT DETECTED
Carbapenem resistance: NOT DETECTED
ENTEROBACTER CLOACAE COMPLEX: NOT DETECTED
ENTEROBACTERIACEAE SPECIES: DETECTED — AB
ESCHERICHIA COLI: DETECTED — AB
Enterococcus species: NOT DETECTED
Haemophilus influenzae: NOT DETECTED
KLEBSIELLA PNEUMONIAE: DETECTED — AB
Klebsiella oxytoca: DETECTED — AB
Listeria monocytogenes: NOT DETECTED
Neisseria meningitidis: NOT DETECTED
PROTEUS SPECIES: NOT DETECTED
PSEUDOMONAS AERUGINOSA: NOT DETECTED
STREPTOCOCCUS AGALACTIAE: NOT DETECTED
STREPTOCOCCUS PYOGENES: NOT DETECTED
Serratia marcescens: NOT DETECTED
Staphylococcus aureus (BCID): NOT DETECTED
Staphylococcus species: NOT DETECTED
Streptococcus pneumoniae: NOT DETECTED
Streptococcus species: NOT DETECTED

## 2017-03-26 LAB — POCT I-STAT 7, (LYTES, BLD GAS, ICA,H+H)
ACID-BASE DEFICIT: 8 mmol/L — AB (ref 0.0–2.0)
BICARBONATE: 17.9 mmol/L — AB (ref 20.0–28.0)
CALCIUM ION: 0.83 mmol/L — AB (ref 1.15–1.40)
HCT: 25 % — ABNORMAL LOW (ref 36.0–46.0)
Hemoglobin: 8.5 g/dL — ABNORMAL LOW (ref 12.0–15.0)
O2 Saturation: 96 %
PH ART: 7.316 — AB (ref 7.350–7.450)
PO2 ART: 87 mmHg (ref 83.0–108.0)
Patient temperature: 98.6
Potassium: 5.7 mmol/L — ABNORMAL HIGH (ref 3.5–5.1)
Sodium: 126 mmol/L — ABNORMAL LOW (ref 135–145)
TCO2: 19 mmol/L — ABNORMAL LOW (ref 22–32)
pCO2 arterial: 35 mmHg (ref 32.0–48.0)

## 2017-03-26 LAB — BASIC METABOLIC PANEL
ANION GAP: 14 (ref 5–15)
BUN: 44 mg/dL — AB (ref 6–20)
CHLORIDE: 94 mmol/L — AB (ref 101–111)
CO2: 17 mmol/L — ABNORMAL LOW (ref 22–32)
Calcium: 6.2 mg/dL — CL (ref 8.9–10.3)
Creatinine, Ser: 1.8 mg/dL — ABNORMAL HIGH (ref 0.44–1.00)
GFR calc Af Amer: 36 mL/min — ABNORMAL LOW (ref >60)
GFR, EST NON AFRICAN AMERICAN: 31 mL/min — AB (ref >60)
Glucose, Bld: 88 mg/dL (ref 65–99)
POTASSIUM: 6 mmol/L — AB (ref 3.5–5.1)
SODIUM: 125 mmol/L — AB (ref 135–145)

## 2017-03-26 LAB — GLUCOSE, CAPILLARY
GLUCOSE-CAPILLARY: 102 mg/dL — AB (ref 65–99)
GLUCOSE-CAPILLARY: 94 mg/dL (ref 65–99)
Glucose-Capillary: 107 mg/dL — ABNORMAL HIGH (ref 65–99)
Glucose-Capillary: 65 mg/dL (ref 65–99)
Glucose-Capillary: 74 mg/dL (ref 65–99)

## 2017-03-26 LAB — ECHOCARDIOGRAM COMPLETE
Height: 60 in
Weight: 2927.71 oz

## 2017-03-26 LAB — MAGNESIUM
MAGNESIUM: 1.7 mg/dL (ref 1.7–2.4)
Magnesium: 1.8 mg/dL (ref 1.7–2.4)
Magnesium: 1.7 mg/dL (ref 1.7–2.4)

## 2017-03-26 LAB — BRAIN NATRIURETIC PEPTIDE: B Natriuretic Peptide: 1326.3 pg/mL — ABNORMAL HIGH (ref 0.0–100.0)

## 2017-03-26 LAB — PHOSPHORUS
Phosphorus: 7.4 mg/dL — ABNORMAL HIGH (ref 2.5–4.6)
Phosphorus: 7.6 mg/dL — ABNORMAL HIGH (ref 2.5–4.6)
Phosphorus: 7 mg/dL — ABNORMAL HIGH (ref 2.5–4.6)

## 2017-03-26 LAB — TROPONIN I: Troponin I: <0.03 ng/mL

## 2017-03-26 LAB — TSH: TSH: 0.484 u[IU]/mL (ref 0.350–4.500)

## 2017-03-26 LAB — PROTIME-INR
INR: 2.32
Prothrombin Time: 25.3 s — ABNORMAL HIGH (ref 11.4–15.2)

## 2017-03-26 LAB — TRIGLYCERIDES: Triglycerides: 147 mg/dL (ref <150)

## 2017-03-26 LAB — MRSA PCR SCREENING: MRSA BY PCR: NEGATIVE

## 2017-03-26 LAB — OSMOLALITY, URINE: Osmolality, Ur: 377 mOsm/kg (ref 300–900)

## 2017-03-26 LAB — AMYLASE: Amylase: 33 U/L (ref 28–100)

## 2017-03-26 LAB — SODIUM, URINE, RANDOM: Sodium, Ur: 42 mmol/L

## 2017-03-26 LAB — OSMOLALITY: Osmolality: 280 mosm/kg (ref 275–295)

## 2017-03-26 MED ORDER — NOREPINEPHRINE BITARTRATE 1 MG/ML IV SOLN
0.0000 ug/min | INTRAVENOUS | Status: DC
  Administered 2017-03-26: 18 ug/min via INTRAVENOUS
  Filled 2017-03-26 (×3): qty 16

## 2017-03-26 MED ORDER — VANCOMYCIN HCL IN DEXTROSE 750-5 MG/150ML-% IV SOLN
750.0000 mg | Freq: Two times a day (BID) | INTRAVENOUS | Status: DC
  Administered 2017-03-26: 750 mg via INTRAVENOUS
  Filled 2017-03-26 (×2): qty 150

## 2017-03-26 MED ORDER — DEXTROSE 5 % IV SOLN
2.0000 g | Freq: Two times a day (BID) | INTRAVENOUS | Status: DC
  Filled 2017-03-26: qty 2

## 2017-03-26 MED ORDER — AMIODARONE HCL IN DEXTROSE 360-4.14 MG/200ML-% IV SOLN
30.0000 mg/h | INTRAVENOUS | Status: DC
  Administered 2017-03-27: 30 mg/h via INTRAVENOUS
  Filled 2017-03-26: qty 200

## 2017-03-26 MED ORDER — SODIUM CHLORIDE 0.9 % IV SOLN
INTRAVENOUS | Status: DC
  Administered 2017-03-26 (×2): via INTRAVENOUS

## 2017-03-26 MED ORDER — SODIUM CHLORIDE 0.9 % IV BOLUS (SEPSIS)
500.0000 mL | Freq: Once | INTRAVENOUS | Status: AC
  Administered 2017-03-26: 500 mL via INTRAVENOUS

## 2017-03-26 MED ORDER — AMIODARONE HCL IN DEXTROSE 360-4.14 MG/200ML-% IV SOLN
60.0000 mg/h | INTRAVENOUS | Status: DC
  Administered 2017-03-26 – 2017-03-27 (×2): 60 mg/h via INTRAVENOUS
  Filled 2017-03-26: qty 200

## 2017-03-26 MED ORDER — AMIODARONE LOAD VIA INFUSION
150.0000 mg | Freq: Once | INTRAVENOUS | Status: AC
  Administered 2017-03-26: 150 mg via INTRAVENOUS
  Filled 2017-03-26: qty 83.34

## 2017-03-26 MED ORDER — AMIODARONE HCL IN DEXTROSE 360-4.14 MG/200ML-% IV SOLN
INTRAVENOUS | Status: AC
  Administered 2017-03-26: 60 mg/h via INTRAVENOUS
  Filled 2017-03-26: qty 200

## 2017-03-26 MED ORDER — PRO-STAT SUGAR FREE PO LIQD
30.0000 mL | Freq: Two times a day (BID) | ORAL | Status: DC
  Administered 2017-03-26: 30 mL
  Filled 2017-03-26: qty 30

## 2017-03-26 MED ORDER — SODIUM CHLORIDE 0.9 % IV SOLN
2.0000 g | Freq: Two times a day (BID) | INTRAVENOUS | Status: DC
  Administered 2017-03-26 (×2): 2 g via INTRAVENOUS
  Filled 2017-03-26 (×3): qty 2

## 2017-03-26 MED ORDER — TBO-FILGRASTIM 480 MCG/0.8ML ~~LOC~~ SOSY
480.0000 ug | PREFILLED_SYRINGE | Freq: Every day | SUBCUTANEOUS | Status: DC
  Administered 2017-03-26: 480 ug via SUBCUTANEOUS
  Filled 2017-03-26 (×2): qty 0.8

## 2017-03-26 MED ORDER — PROPOFOL 1000 MG/100ML IV EMUL
5.0000 ug/kg/min | INTRAVENOUS | Status: DC
  Administered 2017-03-26: 35 ug/kg/min via INTRAVENOUS
  Administered 2017-03-26: 5 ug/kg/min via INTRAVENOUS
  Filled 2017-03-26: qty 100

## 2017-03-26 MED ORDER — SODIUM CHLORIDE 0.9 % IV SOLN
0.0300 [IU]/min | INTRAVENOUS | Status: DC
  Administered 2017-03-27: 0.03 [IU]/min via INTRAVENOUS
  Filled 2017-03-26: qty 2

## 2017-03-26 MED ORDER — SODIUM CHLORIDE 0.9 % IV SOLN
5.0000 ng/kg/min | INTRAVENOUS | Status: DC
  Administered 2017-03-26: 5 ng/kg/min via INTRAVENOUS
  Administered 2017-03-27: 40 ng/kg/min via INTRAVENOUS
  Filled 2017-03-26 (×2): qty 1

## 2017-03-26 MED ORDER — VITAL HIGH PROTEIN PO LIQD: 1000.0000 mL | ORAL | Status: DC

## 2017-03-26 MED ORDER — VITAL HIGH PROTEIN PO LIQD
1000.0000 mL | ORAL | Status: DC
  Administered 2017-03-26: 1000 mL

## 2017-03-26 MED ORDER — HYDROCORTISONE NA SUCCINATE PF 100 MG IJ SOLR
50.0000 mg | Freq: Four times a day (QID) | INTRAMUSCULAR | Status: DC
  Administered 2017-03-26 – 2017-03-27 (×5): 50 mg via INTRAVENOUS
  Filled 2017-03-26 (×3): qty 1
  Filled 2017-03-26: qty 2
  Filled 2017-03-26 (×2): qty 1

## 2017-03-26 MED ORDER — PRO-STAT SUGAR FREE PO LIQD
30.0000 mL | Freq: Three times a day (TID) | ORAL | Status: DC
  Administered 2017-03-26 – 2017-03-27 (×3): 30 mL
  Filled 2017-03-26 (×4): qty 30

## 2017-03-26 MED ORDER — DEXTROSE 5 % IV SOLN
0.0000 ug/min | INTRAVENOUS | Status: DC
  Filled 2017-03-26: qty 4

## 2017-03-26 MED ORDER — FENTANYL 2500MCG IN NS 250ML (10MCG/ML) PREMIX INFUSION
25.0000 ug/h | INTRAVENOUS | Status: DC
  Administered 2017-03-26: 150 ug/h via INTRAVENOUS
  Administered 2017-03-27: 225 ug/h via INTRAVENOUS
  Filled 2017-03-26 (×3): qty 250

## 2017-03-26 MED ORDER — VANCOMYCIN HCL 10 G IV SOLR
1250.0000 mg | INTRAVENOUS | Status: DC
  Administered 2017-03-27: 1250 mg via INTRAVENOUS
  Filled 2017-03-26: qty 1250

## 2017-03-26 MED ORDER — FLUDROCORTISONE ACETATE 0.1 MG PO TABS
0.1000 mg | ORAL_TABLET | Freq: Every day | ORAL | Status: DC
  Administered 2017-03-26: 0.1 mg via ORAL
  Filled 2017-03-26 (×2): qty 1

## 2017-03-26 MED ORDER — SODIUM CHLORIDE 0.9 % IV SOLN
1.0000 g | Freq: Once | INTRAVENOUS | Status: AC
  Administered 2017-03-26: 1 g via INTRAVENOUS
  Filled 2017-03-26: qty 10

## 2017-03-26 MED ORDER — SODIUM CHLORIDE 0.9 % IV SOLN: INTRAVENOUS | Status: DC | PRN

## 2017-03-26 NOTE — Progress Notes (Signed)
  Echocardiogram 2D Echocardiogram has been performed.  Jannett Celestine 03/26/2017, 4:13 PM

## 2017-03-26 NOTE — Progress Notes (Signed)
[facility] Progress Note Patient Name: Marisa Kim DOB: [birth year] MRN: [id number]   Date of Service  03/26/2017  HPI/Events of Note    eICU Interventions  Hypocalcemia- repleted     Intervention Category Intermediate Interventions: Electrolyte abnormality - evaluation and management  ALVA,RAKESH V. 03/26/2017, 11:13 PM

## 2017-03-26 NOTE — Progress Notes (Signed)
ET tube pulled back to 21cm at the lip per MD.  Pt tolerated well, RT to monitor and assess as needed.

## 2017-03-26 NOTE — ED Notes (Signed)
[facility] picking up patient and taking to [facility] bed [facility].

## 2017-03-26 NOTE — H&P (Signed)
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 Patient was seen in the ED for shortness of breath on 10/18 and given breathing treatments and sent home after ruling out PE/ACS. As per patient and her family she didn't improve after going home and kept getting worse. Denies chest pain but had dyspnea on exertion and new onset orthopnea. After the chemo patient began to have progressive weakness and lack of appetitie. 2 days ago patient began to have B/L leg swelling and diarrhea. Denies blood in stool. Denies dysuria/hematuria. + non productive cough. Denies fevers or abdominal pain. Patient did notice her abdomen getting bigger.   Was seen at [facility]. Patient was uncomfortable and in respiratory distress. Did not tolerate BIPAP. Saturating well on 10L [place] but very clearly dyspneic. Patient was given 2.5 L of IVF in the ED and started on broad spectrum ABX. Patient states that she is very tired. Current condition was explained to patient husband and daughter (nurse at [facility]) and agreed to intubate the patient.   Transferred to [facility] for closer monitoring.   PAST MEDICAL HISTORY :  She  has a past medical history of Cervical ca ([place]) and Pulmonary stenosis.  PAST SURGICAL HISTORY: She  has a past surgical history that includes Cardiac surgery and Cervix surgery.  No Known Allergies  No current facility-administered medications on file prior to encounter.    Current Outpatient Prescriptions on File Prior to Encounter  Medication Sig  . dexamethasone (DECADRON) 4 MG  tablet Take 2 tablets (8 mg total) by mouth daily. Start the day after chemotherapy for 2 days.  Marland Kitchen LORazepam (ATIVAN) 0.5 MG tablet Take 1 tablet (0.5 mg total) by mouth every 6 (six) hours as needed (Nausea or vomiting).  . morphine (MS CONTIN) 60 MG 12 hr tablet Take 1 tablet (60 mg total) by mouth every 12 (twelve) hours.  . ondansetron (ZOFRAN) 8 MG tablet Take 1 tablet (8 mg total) by mouth 2 (two) times daily as needed for refractory nausea / vomiting. Start on day 3 after chemo.  . prochlorperazine (COMPAZINE) 10 MG tablet Take 1 tablet (10 mg total) by mouth every 6 (six) hours as needed (Nausea or vomiting).  Marland Kitchen oxyCODONE 10 MG TABS Take 1 tablet (10 mg total) by mouth every 4 (four) hours as needed for moderate pain.    FAMILY HISTORY:  Her indicated that her mother is deceased. She indicated that her father is deceased.    SOCIAL HISTORY: She  reports that she quit smoking about 7 years ago. She has a 60.00 pack-year smoking history. She has never used smokeless tobacco. She reports that she does not drink alcohol or use drugs.  REVIEW OF SYSTEMS:  As per HPI  VITAL SIGNS: BP (!) 87/56   Pulse 96   Temp 97.7 F (36.5 C) (Oral)   Resp (!) 22   Ht 5' (1.524 m)   Wt 182 lb 15.7 oz (83 kg)   SpO2 96%   BMI 35.74 kg/m   HEMODYNAMICS:    VENTILATOR SETTINGS: Vent Mode: PRVC FiO2 (%):  [  40 %-60 %] 40 % Set Rate:  [16 bmp-22 bmp] 22 bmp Vt Set:  [370 mL] 370 mL PEEP:  [5 cmH20] 5 cmH20 Plateau Pressure:  [15 cmH20-19 cmH20] 15 cmH20  INTAKE / OUTPUT: No intake/output data recorded.  PHYSICAL EXAMINATION: General:  Female older then stated age lying in bed in moderate respiratory distress. Mildly lethargic  Neuro:  AAOx3. EOMI  PERRLA  No focal deficits seen.  HEENT:  tongue midline  No facial asymmetry  Cardiovascular:  Sinus tachycardia  Lungs: poor air entry b/l  +crackles B/L  Minimal wheezing appreciated b/l  Abdomen:  Distended and firm. Moderate tenderness on  palpation of Left and right lower quadrants. +BS no guarding or rebound +anasarca Musculoskeletal:  5/5 muscle strength in all 4 ext   +pulses all 4 ext   +4 pitting edema of the bilateral lower ext Skin:  Intact  Multiple bruises at site if IV puncture    LABS:  BMET  Recent Labs Lab 03/19/17 1033 03/22/17 1930 03/12/2017 1853  NA 132* 125* 126*  K 4.7 5.2* 4.8  CL  --  85* 83*  CO2 24 24 23   BUN 12.6 34* 47*  CREATININE 0.8 0.99 1.63*  GLUCOSE 82 116* 59*    Electrolytes  Recent Labs Lab 03/19/17 1033 03/22/17 1930 03/28/2017 1853  CALCIUM 8.9 8.5* 7.8*    CBC  Recent Labs Lab 03/19/17 1033 03/22/17 1930 03/22/2017 1853  WBC 21.5* 21.1* 0.2*  HGB 10.4* 10.5* 10.3*  HCT 32.4* 32.3* 30.8*  PLT 450* 317 73*    Coag's  Recent Labs Lab 03/22/17 1930  INR 1.31    Sepsis Markers  Recent Labs Lab 03/15/2017 1908 04/01/2017 2217  LATICACIDVEN 7.34* 6.72*    ABG  Recent Labs Lab 03/09/2017 2330  PHART 7.141*  PCO2ART 60.4*  PO2ART 107    Liver Enzymes  Recent Labs Lab 03/19/17 1033 03/06/2017 1853  AST 140* 175*  ALT 35 73*  ALKPHOS 240* 210*  BILITOT 1.79* 3.6*  ALBUMIN 2.3* 2.2*    Cardiac Enzymes No results for input(s): TROPONINI, PROBNP in the last 168 hours.  Glucose  Recent Labs Lab 03/26/17 0050  GLUCAP 74    Imaging Ct Abdomen Pelvis Wo Contrast  Result Date: 03/22/2017 CLINICAL DATA:  Acute onset of neutropenia and generalized abdominal distention. Current history of metastatic cancer to the liver. Initial encounter. EXAM: CT ABDOMEN AND PELVIS WITHOUT CONTRAST TECHNIQUE: Multidetector CT imaging of the abdomen and pelvis was performed following the standard protocol without IV contrast. COMPARISON:  CT of the abdomen and pelvis, and MRI of the abdomen, performed 03/03/2017 FINDINGS: Lower chest: Patchy bibasilar airspace opacities raise concern for multifocal pneumonia. The heart is borderline normal in size. The enteric tube is  noted ending at the body of the stomach. Hepatobiliary: Innumerable metastases are noted throughout the liver, with trace surrounding ascites. Stones are noted within the gallbladder. The gallbladder is otherwise unremarkable. Pancreas: The pancreas is within normal limits. Spleen: The spleen is unremarkable in appearance. Adrenals/Urinary Tract: Nonspecific stranding is noted about the adrenal glands. The adrenal glands are otherwise unremarkable. Mild nonspecific perinephric stranding is noted bilaterally. The kidneys are otherwise unremarkable. There is no evidence of hydronephrosis. No renal or ureteral stones are identified. Stomach/Bowel: The stomach is unremarkable in appearance. The small bowel is within normal limits. The appendix is normal in caliber, without evidence of appendicitis. There is fatty infiltration within the wall of the cecum and ascending colon, reflecting chronic sequelae of  inflammation. Mild wall thickening is noted along the distal transverse and descending colon, raising question for a mild infectious or inflammatory process. Vascular/Lymphatic: Scattered calcification is seen along the abdominal aorta and its branches. The abdominal aorta is otherwise grossly unremarkable. The inferior vena cava is grossly unremarkable. No retroperitoneal lymphadenopathy is seen. No pelvic sidewall lymphadenopathy is identified. Reproductive: The bladder is mildly distended and grossly unremarkable. The uterus is unremarkable in appearance. The ovaries are relatively symmetric. No suspicious adnexal masses are seen. Other: Soft tissue edema is noted along the abdominal and pelvic wall, raising question for mild anasarca. A 3.5 cm prominent subcutaneous soft tissue nodule is noted overlying the right gluteus musculature. Musculoskeletal: No acute osseous abnormalities are identified. Facet disease is noted at the lower lumbar spine. The visualized musculature is unremarkable in appearance. IMPRESSION:  1. Patchy bibasilar airspace opacities raise concern for multifocal pneumonia. 2. Mild wall thickening along the distal transverse and descending colon raises question for a mild infectious or inflammatory process. 3. Innumerable metastases again noted throughout the liver, with trace surrounding ascites. 4. Soft tissue edema along the abdominal and pelvic wall raises question for mild anasarca. 5. 3.5 cm prominent subcutaneous soft tissue nodule again noted overlying the right gluteus musculature. 6. Cholelithiasis. 7. Scattered aortic atherosclerosis. Electronically Signed   By: Garald Balding M.D.   On: 03/14/2017 23:57   Portable Chest Xray  Result Date: 03/15/2017 CLINICAL DATA:  Endotracheal tube and nasogastric tube placement. Initial encounter. EXAM: PORTABLE CHEST 1 VIEW COMPARISON:  Chest radiograph performed earlier today at 6:03 p.m. FINDINGS: The patient's endotracheal tube is seen ending 1-2 cm above the carina. This could be retracted 1-2 cm. An enteric tube is noted extending below the diaphragm. Retrocardiac airspace opacity raises concern for pneumonia. No pleural effusion or pneumothorax is seen. The cardiomediastinal silhouette is borderline normal in size. No acute osseous abnormalities are identified. IMPRESSION: 1. Endotracheal tube seen ending 1-2 cm above the carina. This could be retracted 1-2 cm. 2. Enteric tube noted extending below the diaphragm. 3. Retrocardiac airspace opacity raises concern for pneumonia. Electronically Signed   By: Garald Balding M.D.   On: 03/10/2017 23:42   Dg Chest Port 1 View  Result Date: 03/20/2017 CLINICAL DATA:  Short of breath EXAM: PORTABLE CHEST 1 VIEW COMPARISON:  CT chest 03/22/2014 FINDINGS: New area of left lower lobe airspace disease, consistent with pneumonia. No significant effusion. Negative for heart failure. Right lower lobe density seen on the prior study not well visualized today. IMPRESSION: Left lower lobe infiltrate, probable  pneumonia Electronically Signed   By: Franchot Gallo M.D.   On: 03/26/2017 18:59     STUDIES:  ECHO: Doppler Lower Ext:  CULTURES: Blood culture 03/11/2017:  ANTIBIOTICS: Vancomycin 10/21 Cefepime 10/21  SIGNIFICANT EVENTS: Intubated ED for respiratory distress 10/21  LINES/TUBES: PIV Foley 10/21 Vent: 10/21  DISCUSSION: 50 year old female with recent diagnosis of right lung CA with lever mets presents for worsening shortness of breath and weakness along with new N/V/diarrhea.  Neutropenic s/p chemo on monday  ASSESSMENT / PLAN:  PULMONARY A: Ventilator dependent respiratory failure secondary to acute hypoxic respiratory failure secondary to new onset heart failure and HCAP Respiratory acidosis H/O congenital Pulmonary stenosis  Lung Ca with liver mets P:   -Bedside echo shows B lines B/L showing evidence of volume overload. Given 2.5 L in the ED. Will manage with more conservative fluid administration -VAP precautions -f/u AM ABG -increase respiratory rate and tidal volume to  correct respiratory acidosis and double triggering on vent  Heme/Onc consulted   CARDIOVASCULAR A: Sinus tachycardia Septic Shock R/O congestive heart failure secondary to chemo  H/O HTN P:  Maintain MAP>65. Started on levophed overnight  F/u echo Bedside echo shows dilated IVC. Poor cardiac windows due to patient body habitus and sitting up in bed. Enlarged LV. No pericardial fluid.  F/u troponin. BNP elevated in setting of morbid obesity    RENAL A:  Anion Gap metabolic acidosis secondary to Lactic acidosis  Acute renal failure secondary to pre-renal (most likely ) secondary to poor PO intake vs renal (ATN) Hyponatremia secondary to hypervolemic hyponatremia (SIADH vs CHF) vs Poor po intake  P:   Foley placed Monitor I/O Replace electrolytes as needed and f/u morning BNP F/u urine sodium, urine osmol and serum osmol Conservative fluid management due to volume overload Lactic  acidosis mildly improved from 7.34--> 6.72  GASTROINTESTINAL A:  Neutropenic typhlitis with inflammation of transverse and descending colon. Cholelithiasis  Liver mets with mild surrounding ascites  P:   NPO  Continue with broad spectrum abx   HEMATOLOGIC A:  Neutropenic with Absolute neutrophil count  0.01 secondary to chemotherapy New Lung Ca with liver mets thrombocytopenia  microcytic anemia    P:  Onc consulted. Pancytopenia most likley all due to recent chemo. Will discuss with Onc if patient will benefit from filgrastin  Patient states she has been having some vaginal bleeding. Unclear if this is new or old and if worked up   INFECTIOUS A:  Neutropenic fever Neutropenic typhlitis Multifocal PNA  P:   Continue broad spectrum abx Vancomycin and cefepime F/u blood cultures UA normal Neutropenic precaution   ENDOCRINE A:  R/O adrenal insufficiency   P:   Patient is on 8mg  dexamethasone started on Monday after chemo.  Unlikely adrenal insufficieny due to short duration of sterids. Given hydrocortisone 100 mg in ED. Will continue 100 Mg BID and taper off in the next 48-72 hours.   NEUROLOGIC A:  No acute event  P:   RASS goal: -1 to 0 On propofol and fentanyl Daily sedation vacation    FAMILY  Had extensive discussion with patient, her husband and her daughter who is a Marine scientist at [employer]. They have full understanding of current diagnosis of neutropenic fever and were in full understanding of reasons for intubation. All questions answered.   Full code   [place] Pager: [phone]  03/26/2017, 4:25 AM

## 2017-03-26 NOTE — Progress Notes (Signed)
1:59 AM. 03/26/2017  Irregular sized pupils noted on assessment.  Dr Marcello Moores made aware.     03/26/17 0100  Neurological  Pupil Assessment  Yes  R Pupil Size (mm) 4  R Pupil Shape Round  R Pupil Reaction Sluggish  L Pupil Size (mm) 1  L Pupil Shape Round  L Pupil Reaction Sluggish     Mick Sell RN

## 2017-03-26 NOTE — Procedures (Signed)
Arterial Catheter Insertion Procedure Note Marisa Kim [id number] 03-30-1966  Procedure: Insertion of Arterial Catheter  Indications: Blood pressure monitoring and Frequent blood sampling  Procedure Details Consent: Risks of procedure as well as the alternatives and risks of each were explained to the (patient/caregiver).  Consent for procedure obtained. Time Out: Verified patient identification, verified procedure, site/side was marked, verified correct patient position, special equipment/implants available, medications/allergies/relevent history reviewed, required imaging and test results available.  Performed  Maximum sterile technique was used including antiseptics, cap, gloves, gown, hand hygiene, mask and sheet. Skin prep: Chlorhexidine; local anesthetic administered 20 gauge catheter was inserted into left radial artery using the Seldinger technique.  Evaluation Blood flow good; BP tracing good. Complications: No apparent complications.   Marisa Kim 03/26/2017  [place]   Marisa Titus Mould, MD, [place] Pgr: [facility]

## 2017-03-26 NOTE — Progress Notes (Signed)
eLink Physician-Brief Progress Note Patient Name: Jessey Huyett DOB: Apr 04, 1966 MRN: [id number]   Date of Service  03/26/2017  HPI/Events of Note  New onset AF-RVR Drop in BP on max giapreza + levo gtt  eICU Interventions  Confirmed on EKG Amio bolus + infusion Chk ABG >> acidosis  Add vaso      Intervention Category Major Interventions: Arrhythmia - evaluation and management  Rondarius Kadrmas V. 03/26/2017, 10:27 PM

## 2017-03-26 NOTE — Progress Notes (Signed)
Pt transported to and from CT on [facility] without incident.  RT to monitor and assess as needed.

## 2017-03-26 NOTE — Procedures (Signed)
ELECTROENCEPHALOGRAM REPORT  Date of Study: 03/26/2017  Patient's Name: Marisa Kim MRN: [id number] Date of Birth: June 13, 1965  Referring Provider: Dr. Kara Mead  Clinical History: This is a 51 year old woman with altered mental status.  Medications: fentaNYL 2585mcg in NS 257mL (73mcg/ml) infusion-PREMIX  fludrocortisone (FLORINEF) tablet 0.1 mg  heparin injection 5,000 Units  hydrocortisone sodium succinate (SOLU-CORTEF) 100 MG injection 50 mg  ipratropium-albuterol (DUONEB) 0.5-2.5 (3) MG/3ML nebulizer solution 3 mL  meropenem (MERREM) 2 g in sodium chloride 0.9 % 100 mL IVPB  norepinephrine (LEVOPHED) 4 mg in dextrose 5 % 250 mL (0.016 mg/mL) infusion  pantoprazole (PROTONIX) injection 40 mg  succinylcholine (ANECTINE) injection 125 mg  Tbo-Filgrastim (GRANIX) injection 480 mcg  vancomycin (VANCOCIN) IVPB 750 mg/150 ml premix   Technical Summary: A multichannel digital EEG recording measured by the international 10-20 system with electrodes applied with paste and impedances below 5000 ohms performed as portable with EKG monitoring in an intubated and sedated patient.  Hyperventilation and photic stimulation were not performed.  The digital EEG was referentially recorded, reformatted, and digitally filtered in a variety of bipolar and referential montages for optimal display.   Description: The patient is intubated and sedated on Fentanyl during the recording. There is no clear posterior dominant rhythm seen. The background consists of a large amount of diffuse 4-5 Hz theta and 2-3 Hz delta slowing. There are occasional poorly formed vertex waves seen. With noxious stimulation, there is slight increase in faster frequencies and muscle artifact. Hyperventilation and photic stimulation were not performed. There were no epileptiform discharges or electrographic seizures seen.    EKG lead showed sinus tachycardia.  Impression: This sedated EEG is abnormal due to moderate diffuse  background slowing.  Clinical Correlation of the above findings indicates diffuse cerebral dysfunction that is non-specific in etiology and can be seen with hypoxic/ischemic injury, toxic/metabolic encephalopathies, or medication effect from Fentanyl.  The absence of epileptiform discharges does not rule out a clinical diagnosis of epilepsy.  Clinical correlation is advised.   Ellouise Newer, M.D.

## 2017-03-26 NOTE — Progress Notes (Signed)
Lactic acid 6.0 called from lab . Dr Juleen China at bedside informed of results.Also informed him of unequal pupils R > L. . Right pupil 4-15mm and left is 2 mm. Also noted to Dr Juleen China that levophed is infusing peripherally

## 2017-03-26 NOTE — Progress Notes (Signed)
PHARMACY - PHYSICIAN COMMUNICATION CRITICAL VALUE ALERT - BLOOD CULTURE IDENTIFICATION (BCID)  Results for orders placed or performed during the hospital encounter of 03/31/2017  Blood Culture ID Panel (Reflexed) (Collected: 03/28/2017  6:53 PM)  Result Value Ref Range   Enterococcus species NOT DETECTED NOT DETECTED   Listeria monocytogenes NOT DETECTED NOT DETECTED   Staphylococcus species NOT DETECTED NOT DETECTED   Staphylococcus aureus NOT DETECTED NOT DETECTED   Streptococcus species NOT DETECTED NOT DETECTED   Streptococcus agalactiae NOT DETECTED NOT DETECTED   Streptococcus pneumoniae NOT DETECTED NOT DETECTED   Streptococcus pyogenes NOT DETECTED NOT DETECTED   Acinetobacter baumannii NOT DETECTED NOT DETECTED   Enterobacteriaceae species DETECTED (A) NOT DETECTED   Enterobacter cloacae complex NOT DETECTED NOT DETECTED   Escherichia coli DETECTED (A) NOT DETECTED   Klebsiella oxytoca DETECTED (A) NOT DETECTED   Klebsiella pneumoniae DETECTED (A) NOT DETECTED   Proteus species NOT DETECTED NOT DETECTED   Serratia marcescens NOT DETECTED NOT DETECTED   Carbapenem resistance NOT DETECTED NOT DETECTED   Haemophilus influenzae NOT DETECTED NOT DETECTED   Neisseria meningitidis NOT DETECTED NOT DETECTED   Pseudomonas aeruginosa NOT DETECTED NOT DETECTED   Candida albicans NOT DETECTED NOT DETECTED   Candida glabrata NOT DETECTED NOT DETECTED   Candida krusei NOT DETECTED NOT DETECTED   Candida parapsilosis NOT DETECTED NOT DETECTED   Candida tropicalis NOT DETECTED NOT DETECTED   51 year old female admitted with febrile neutropenia. She has one blood culture that is growing Gram negative rods. Three organisms are detected on BCID: E coli, Klebsiella oxytoca, and Klebsiella pneumoniae. Her gram negative coverage was just broadened to meropenem earlier today and this should be adequate for these organisms. Discussed with the 63M pharmacist today - if additional coverage is needed  would consider an aminoglycoside but this would be risky with her AKI.  Name of physician (or Provider) Contacted: Titus Mould   Changes to prescribed antibiotics required: None - would continue meropenem pending further antimicrobial sensitivities.  Norva Riffle 03/26/2017  2:43 PM

## 2017-03-26 NOTE — Procedures (Signed)
Central Venous Catheter Insertion Procedure Note Starr Urias [id number] 05-25-1966  Procedure: Insertion of Central Venous Catheter Indications: Assessment of intravascular volume, Drug and/or fluid administration and Frequent blood sampling  Procedure Details Consent: Risks of procedure as well as the alternatives and risks of each were explained to the (patient/caregiver).  Consent for procedure obtained. Time Out: Verified patient identification, verified procedure, site/side was marked, verified correct patient position, special equipment/implants available, medications/allergies/relevent history reviewed, required imaging and test results available.  Performed  Maximum sterile technique was used including antiseptics, cap, gloves, gown, hand hygiene, mask and sheet. Skin prep: Chlorhexidine; local anesthetic administered A antimicrobial bonded/coated triple lumen catheter was placed in the left internal jugular vein using the Seldinger technique.  Evaluation Blood flow good Complications: No apparent complications Patient did tolerate procedure well. Chest X-ray ordered to verify placement.  CXR: pending.  [name] 03/26/2017, 11:50 AM  [place]  Hughie Melroy J. Titus Mould, MD, [place] Pgr: [facility]

## 2017-03-26 NOTE — Progress Notes (Addendum)
Addendum: SCr increasing, currently 1.74 with estimated CrCl ~ 35 mL/min. Will dose reduce Vancomycin to 1250mg  IV every 24 hours - next dose tomorrow AM. Monitor renal function closely for acute changes and need to further dose adjust.   Sloan Leiter, PharmD, BCPS Clinical Pharmacist Clinical phone 03/26/2017 until 3:30PM - #[phone] After hours, please call [phone] 03/26/2017, 4:11 PM _____  Pharmacy Antibiotic Note  Marisa Kim is a 51 y.o. female with a past medical history significant for recently diagnosed R lung cancer with mets to the liver (on carbo/taxol) also with recent hospital admission at the end of September who presented on 03/07/2017 with sepsis 2/2 HCAP and neutropenia after having her first round of chemo last Monday. She was initially treated with cefepime, but due to lack of clinical progress, coverage was broadened to meropenem. Pharmacy has been consulted for meropenem and vancomycin.   Today, the patient remains severely neutropenic, hypotensive, mildly hypothermic.  SCr remains elevated at 1.53 (CrCl ~41.1 mL/min) but is mildly trending down and she is making urine. Lactic acid also remains elevated at 6.0.   Plan: Vancomycin 750 mg IV q12h Meropenem 2 g q12h Vanc trough at steady state, goal trough 15-20 Follow renal function, clinical course F/u blood cx  Height: 5' (152.4 cm) Weight: 182 lb 15.7 oz (83 kg) IBW/kg (Calculated) : 45.5  Temp (24hrs), Avg:97.5 F (36.4 C), Min:97.1 F (36.2 C), Max:97.8 F (36.6 C)   Recent Labs Lab 03/19/17 1033 03/22/17 1930 03/11/2017 1853 03/18/2017 1908 03/23/2017 2217 03/26/17 0630  WBC 21.5* 21.1* 0.2*  --   --  0.3*  CREATININE 0.8 0.99 1.63*  --   --  1.53*  LATICACIDVEN  --   --   --  7.34* 6.72* 6.0*    Estimated Creatinine Clearance: 41.1 mL/min (A) (by C-G formula based on SCr of 1.53 mg/dL (H)).    No Known Allergies  Antimicrobials this admission: Meropenem 10/22 >> Vancomycin 10/21 >> Cefepime  10/21 >> 10/22   Dose adjustments this admission: Vancomycin 1 g q24 to 750 mg q12  Microbiology results: 10/21 BCx: pending 10/21 UCx: pending  10/22 MRSA PCR: negative  Thank you for allowing pharmacy to be a part of this patient's care.  Sallyanne Havers, PharmD Candidate 03/26/2017 9:51 AM  I discussed / reviewed the pharmacy note by Ms. Dunn and I agree with the student's findings and plans as documented.  Sloan Leiter, PharmD, BCPS Clinical Pharmacist Clinical phone 03/26/2017 until 3:30PM [phone] After hours, please call [phone] 03/26/2017, 11:25 AM

## 2017-03-26 NOTE — Procedures (Signed)
Arterial Catheter Insertion Procedure Note Marisa Kim [id number] [birth year]  Procedure: Insertion of Arterial Catheter  Indications: Blood pressure monitoring and Frequent blood sampling Left was self dc Procedure Details Consent: Risks of procedure as well as the alternatives and risks of each were explained to the (patient/caregiver).  Consent for procedure obtained. Time Out: Verified patient identification, verified procedure, site/side was marked, verified correct patient position, special equipment/implants available, medications/allergies/relevent history reviewed, required imaging and test results available.  Performed  Maximum sterile technique was used including antiseptics, cap, gloves, gown, hand hygiene, mask and sheet. Skin prep: Chlorhexidine; local anesthetic administered 20 gauge catheter was inserted into right radial artery using the Seldinger technique.  Evaluation Blood flow good; BP tracing good. Complications: No apparent complications.   Marisa Kim 03/26/2017  [place]  Marisa Kim. Titus Mould, MD, [place] Pgr: [facility]

## 2017-03-26 NOTE — Progress Notes (Addendum)
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 Patient was seen in the ED for shortness of breath on 10/18 and given breathing treatments and sent home after ruling out PE/ACS. As per patient and her family she didn't improve after going home and kept getting worse. Denies chest pain but had dyspnea on exertion and new onset orthopnea. After the chemo patient began to have progressive weakness and lack of appetitie. 2 days ago patient began to have B/L leg swelling and diarrhea. Denies blood in stool. Denies dysuria/hematuria. + non productive cough. Denies fevers or abdominal pain. Patient did notice her abdomen getting bigger.   Was seen at [place] [facility]. Patient was uncomfortable and in respiratory distress. Did not tolerate BIPAP. Saturating well on 10L [place] but very clearly dyspneic. Patient was given 2.5 L of IVF in the ED and started on broad spectrum ABX. Patient states that she is very tired. Current condition was explained to patient husband and daughter (nurse at [facility]) and agreed to intubate the patient.   Transferred to [facility] for closer monitoring.   SUBJECTIVE:  Patient mechanically ventilated  VITAL SIGNS: BP 93/82   Pulse 89   Temp 97.7 F (36.5 C) (Oral)   Resp 14   Ht 5' (1.524 m)   Wt 182 lb 15.7 oz (83 kg)   SpO2 95%   BMI 35.74 kg/m   HEMODYNAMICS:    VENTILATOR SETTINGS: Vent Mode: PRVC FiO2 (%):  [40 %-60 %] 40 % Set Rate:  [16 bmp-22 bmp] 22 bmp Vt Set:  [370 mL] 370 mL PEEP:  [5 cmH20] 5  cmH20 Plateau Pressure:  [15 cmH20-19 cmH20] 15 cmH20  INTAKE / OUTPUT: I/O last 3 completed shifts: In: 3106.8 [I.V.:306.8; IV Piggyback:2800] Out: 1155 [Emesis/NG output:1155]  PHYSICAL EXAMINATION: General:  Female, lying in bed, intubated Neuro:  Sedated HEENT:  NCAT, right pupil > left, ETT in place Cardiovascular:  RRR, no murmur Lungs:  Mechanically ventilated breath sounds, bibasilar crackles Abdomen:  Obese, soft Musculoskeletal:  SCD in place, bilateral LE edema present Skin:  Intact, dry  LABS:  BMET  Recent Labs Lab 03/22/17 1930 04/02/2017 1853 03/26/17 0630  NA 125* 126* 122*  K 5.2* 4.8 5.0  CL 85* 83* 90*  CO2 24 23 17*  BUN 34* 47* 39*  CREATININE 0.99 1.63* 1.53*  GLUCOSE 116* 59* 74    Electrolytes  Recent Labs Lab 03/22/17 1930 03/22/2017 1853 03/26/17 0630  CALCIUM 8.5* 7.8* 6.6*  MG  --   --  1.8  PHOS  --   --  7.4*    CBC  Recent Labs Lab 03/22/17 1930 03/14/2017 1853 03/26/17 0630  WBC 21.1* 0.2* PENDING  HGB 10.5* 10.3* 9.3*  HCT 32.3* 30.8* 28.5*  PLT 317 73* PENDING    Coag's  Recent Labs Lab 03/22/17 1930  INR 1.31    Sepsis Markers  Recent Labs Lab 03/15/2017 1908 03/20/2017 2217 03/26/17 0630  LATICACIDVEN 7.34* 6.72* 6.0*    ABG  Recent Labs Lab 03/21/2017 2330  PHART  7.141*  PCO2ART 60.4*  PO2ART 107    Liver Enzymes  Recent Labs Lab 03/19/17 1033 03/22/2017 1853 03/26/17 0630  AST 140* 175* 157*  ALT 35 73* 65*  ALKPHOS 240* 210* 150*  BILITOT 1.79* 3.6* 3.2*  ALBUMIN 2.3* 2.2* 1.7*    Cardiac Enzymes  Recent Labs Lab 03/26/17 0630  TROPONINI <0.03    Glucose  Recent Labs Lab 03/26/17 0050  GLUCAP 74    Imaging Ct Abdomen Pelvis Wo Contrast  Result Date: 03/28/2017 CLINICAL DATA:  Acute onset of neutropenia and generalized abdominal distention. Current history of metastatic cancer to the liver. Initial encounter. EXAM: CT ABDOMEN AND PELVIS WITHOUT CONTRAST TECHNIQUE:  Multidetector CT imaging of the abdomen and pelvis was performed following the standard protocol without IV contrast. COMPARISON:  CT of the abdomen and pelvis, and MRI of the abdomen, performed 03/03/2017 FINDINGS: Lower chest: Patchy bibasilar airspace opacities raise concern for multifocal pneumonia. The heart is borderline normal in size. The enteric tube is noted ending at the body of the stomach. Hepatobiliary: Innumerable metastases are noted throughout the liver, with trace surrounding ascites. Stones are noted within the gallbladder. The gallbladder is otherwise unremarkable. Pancreas: The pancreas is within normal limits. Spleen: The spleen is unremarkable in appearance. Adrenals/Urinary Tract: Nonspecific stranding is noted about the adrenal glands. The adrenal glands are otherwise unremarkable. Mild nonspecific perinephric stranding is noted bilaterally. The kidneys are otherwise unremarkable. There is no evidence of hydronephrosis. No renal or ureteral stones are identified. Stomach/Bowel: The stomach is unremarkable in appearance. The small bowel is within normal limits. The appendix is normal in caliber, without evidence of appendicitis. There is fatty infiltration within the wall of the cecum and ascending colon, reflecting chronic sequelae of inflammation. Mild wall thickening is noted along the distal transverse and descending colon, raising question for a mild infectious or inflammatory process. Vascular/Lymphatic: Scattered calcification is seen along the abdominal aorta and its branches. The abdominal aorta is otherwise grossly unremarkable. The inferior vena cava is grossly unremarkable. No retroperitoneal lymphadenopathy is seen. No pelvic sidewall lymphadenopathy is identified. Reproductive: The bladder is mildly distended and grossly unremarkable. The uterus is unremarkable in appearance. The ovaries are relatively symmetric. No suspicious adnexal masses are seen. Other: Soft tissue edema is  noted along the abdominal and pelvic wall, raising question for mild anasarca. A 3.5 cm prominent subcutaneous soft tissue nodule is noted overlying the right gluteus musculature. Musculoskeletal: No acute osseous abnormalities are identified. Facet disease is noted at the lower lumbar spine. The visualized musculature is unremarkable in appearance. IMPRESSION: 1. Patchy bibasilar airspace opacities raise concern for multifocal pneumonia. 2. Mild wall thickening along the distal transverse and descending colon raises question for a mild infectious or inflammatory process. 3. Innumerable metastases again noted throughout the liver, with trace surrounding ascites. 4. Soft tissue edema along the abdominal and pelvic wall raises question for mild anasarca. 5. 3.5 cm prominent subcutaneous soft tissue nodule again noted overlying the right gluteus musculature. 6. Cholelithiasis. 7. Scattered aortic atherosclerosis. Electronically Signed   By: Garald Balding M.D.   On: 03/21/2017 23:57   Portable Chest Xray  Result Date: 03/11/2017 CLINICAL DATA:  Endotracheal tube and nasogastric tube placement. Initial encounter. EXAM: PORTABLE CHEST 1 VIEW COMPARISON:  Chest radiograph performed earlier today at 6:03 p.m. FINDINGS: The patient's endotracheal tube is seen ending 1-2 cm above the carina. This could be retracted 1-2 cm. An enteric tube is noted extending below the diaphragm. Retrocardiac airspace opacity raises  concern for pneumonia. No pleural effusion or pneumothorax is seen. The cardiomediastinal silhouette is borderline normal in size. No acute osseous abnormalities are identified. IMPRESSION: 1. Endotracheal tube seen ending 1-2 cm above the carina. This could be retracted 1-2 cm. 2. Enteric tube noted extending below the diaphragm. 3. Retrocardiac airspace opacity raises concern for pneumonia. Electronically Signed   By: Garald Balding M.D.   On: 03/22/2017 23:42   Dg Chest Port 1 View  Result Date:  03/30/2017 CLINICAL DATA:  Short of breath EXAM: PORTABLE CHEST 1 VIEW COMPARISON:  CT chest 03/22/2014 FINDINGS: New area of left lower lobe airspace disease, consistent with pneumonia. No significant effusion. Negative for heart failure. Right lower lobe density seen on the prior study not well visualized today. IMPRESSION: Left lower lobe infiltrate, probable pneumonia Electronically Signed   By: Franchot Gallo M.D.   On: 03/05/2017 18:59     STUDIES:  ECHO: Doppler Lower Ext:  CULTURES: Blood culture 03/22/2017:  ANTIBIOTICS: Vancomycin 10/21 Cefepime 10/21  SIGNIFICANT EVENTS: Intubated ED for respiratory distress 10/21  LINES/TUBES: PIV Foley 10/21 Vent: 10/21  DISCUSSION: 51 year old female with recent diagnosis of right lung CA with lever mets presents for worsening shortness of breath and weakness along with new N/V/diarrhea.  Neutropenic s/p chemo on monday  ASSESSMENT / PLAN:  PULMONARY A: Ventilator dependent respiratory failure secondary to acute hypoxic respiratory failure secondary to new onset heart failure and HCAP Respiratory acidosis H/O congenital Pulmonary stenosis  Lung Ca with liver mets P:   -Bedside echo showed B lines B/L showing evidence of volume overload. Given 2.5 L in the ED.  -VAP precautions -f/u ABG - Heme/Onc consulted  - See ID - Bronchodilators  CARDIOVASCULAR A: Sinus tachycardia Septic Shock R/O congestive heart failure secondary to chemo  H/O HTN P:  Maintain MAP>65. Started on levophed 34mg this AM F/u echo Bedside echo shows dilated IVC. Poor cardiac windows due to patient body habitus and sitting up in bed. Enlarged LV. No pericardial fluid.  Trop negative BNP >1300   RENAL A:  Anion Gap metabolic acidosis secondary to Lactic acidosis  Acute renal failure secondary to pre-renal (most likely ) secondary to poor PO intake vs renal (ATN) Hyponatremia secondary to hypervolemic hyponatremia (SIADH vs CHF) vs Poor  po intake  P:   Creatinine improving Foley placed Monitor I/O Replace electrolytes as needed  Urine Na 42 F/u  urine osmol and serum osmol Lactic acidosis mildly improved from 7.34--> 6.72 --> 6.0  GASTROINTESTINAL A:  Neutropenic typhlitis with inflammation of transverse and descending colon. Cholelithiasis  Liver mets with mild surrounding ascites  P:   NPO  Continue with broad spectrum abx PPI   HEMATOLOGIC A:  Neutropenic with Absolute neutrophil count  0.01 secondary to chemotherapy New Lung Ca with liver mets thrombocytopenia  microcytic anemia    P:  Heme/Onc consulted. Pancytopenia most likley all due to recent chem Patient states she has been having some vaginal bleeding. Unclear if this is new or old and if worked up  Add PT/INR  INFECTIOUS A:  Neutropenic fever Neutropenic typhlitis Multifocal PNA  P:   Continue broad spectrum abx Vancomycin and cefepime F/u blood cultures UA normal Neutropenic precaution   ENDOCRINE A:  R/O adrenal insufficiency   P:   Patient is on 882mdexamethasone started on Monday after chemo.  Unlikely adrenal insufficiency due to short duration of steroids. Given hydrocortisone 100 mg in ED. Will continue 100 Mg BID and taper off  in the next 48-72 hours.   NEUROLOGIC A:  Right pupil dilated > left  P:   Consider neuroimaging RASS goal: -1 to 0 On propofol and fentanyl Daily sedation vacation    FAMILY  - Updates:   - Inter-disciplinary family meet or Palliative Care meeting due by:  10/29    Pulmonary and [place] Pager: [phone]  03/26/2017, 8:09 AM  STAFF NOTE: I, Merrie Roof, MD FACP have personally reviewed patient's available data, including medical history, events of note, physical examination and test results as part of my evaluation. I have discussed with resident/NP and other care providers such as pharmacist, RN and RRT. In addition, I personally  evaluated patient and elicited key findings of: not awake, rass -3 prop, fent on in drip form, some dyschrony on vent, jvd down, lung sounds crackles bases, ronchi, abdo obese, soft, BS wnl, no r/g, no rash, lower ext edema 3 plus, pcxr which I reviewed shows haziness LLL, ett, ct chest I reviewed shows bibasilar patchy infiltrates left base greater rt, presents with neutropenic sepsis likely from PNA, ANC 84, ABg last reviewed, NO SBT today, abg last noted = combination uncompensated met acidosis and acute resp acidosis, last abg reviewed, rate increase needed, then repeat abg, pcxr in am , keep plat less 30 , at risk ARDS, in septic shock, requires line, levo rising, get cvp, allow pos balance further, ensure cortisol unable as already on stress roids, consider addition florinef, levophed to MAP goals, may need addition vaso, echo assessment to rule out cardiomyopathy, ha sig hyponatremia ( hypotonic, favor hypovolemic), re add saline, bolus, bmet q8h, repeat k in pm, feed, some enteritis from chemo likely  No loose stools, maintain pseudomonal coverage and MRSA, in setting shock and slow lactic would, low threshold to change to meropenem monotherapy, pancytopenia chemo and neutropenia in setting neutropenia, consider addition neupogen, follow ANC, plat just about at 50 k for line and a line needed given poor clinical status, family updated in room by me, on exam some concerns pupil changes, want CT but concern more safety in transport, may be chronic also, low threshold bronch for organism if clinical declines, avoid any rectal tubes etc, dc prop, use fent drip and int versed,  UPDATE, pupil is larger, get eeg and stat head CT The patient is critically ill with multiple organ systems failure and requires high complexity decision making for assessment and support, frequent evaluation and titration of therapies, application of advanced monitoring technologies and extensive interpretation of multiple databases.    Critical Care Time devoted to patient care services described in this note is85 Minutes. This time reflects time of care of this signee: Merrie Roof, MD FACP. This critical care time does not reflect procedure time, or teaching time or supervisory time of PA/NP/Med student/Med Resident etc but could involve care discussion time. Rest per NP/medical resident whose note is outlined above and that I agree with   Marisa Kim. Marisa Mould, MD, [place] Pgr: [facility] 03/26/2017 9:11 AM

## 2017-03-26 NOTE — Progress Notes (Signed)
Initial Nutrition Assessment  DOCUMENTATION CODES:   Obesity unspecified  INTERVENTION:    Vital High Protein at 20 ml/h (480 ml per day)  Pro-stat 30 ml TID  Provides 780 kcal (1237 kcal with Propofol), 87 gm protein, 401 ml free water daily  NUTRITION DIAGNOSIS:   Inadequate oral intake related to inability to eat as evidenced by NPO status.  GOAL:   Provide needs based on ASPEN/SCCM guidelines  MONITOR:   Vent status, TF tolerance, Labs, I & O's  REASON FOR ASSESSMENT:   Consult Enteral/tube feeding initiation and management  ASSESSMENT:   51 yo female with PMH of pulmonary stenosis, cervical cancer, former smoker, new dx of R lung CA with liver mets (recenlty started chemo) who was admitted on 10/21 with sepsis r/t PNA,  new N/V/diarrhea, and  neutropenic fever after having her first round of chemo last Monday. Required intubation on admission.   Unable to complete nutrition focused physical exam at this time. Procedures ongoing in room. Volume overloaded with distended abdomen. TF has been ordered, but not yet initiated. Patient is currently intubated on ventilator support Temp (24hrs), Avg:97.8 F (36.6 C), Min:97.1 F (36.2 C), Max:98.9 F (37.2 C)  Propofol: 17.3 ml/hr providing 457 kcal from lipid Labs reviewed. Sodium 122 (L), phosphorus 7.4 (H) Medications reviewed.  Diet Order:  Diet NPO time specified  Skin:  Reviewed, no issues  Last BM:  10/21  Height:   Ht Readings from Last 1 Encounters:  03/26/17 5' (1.524 m)    Weight:   Wt Readings from Last 1 Encounters:  03/26/17 182 lb 15.7 oz (83 kg)    Ideal Body Weight:  45.5 kg  BMI:  Body mass index is 35.74 kg/m.  Estimated Nutritional Needs:   Kcal:  546-2703  Protein:  91 gm  Fluid:  1.5 L  EDUCATION NEEDS:   No education needs identified at this time  Molli Barrows, [place], [place], [place] Pager [phone] After Hours Pager [phone]

## 2017-03-26 NOTE — Progress Notes (Signed)
Dr Juleen China informed of lab results Ca 6.4 and lactic acid 6.4 and INR 2.32

## 2017-03-26 NOTE — Progress Notes (Signed)
EEG Completed; Results Pending  

## 2017-03-26 NOTE — Telephone Encounter (Signed)
Received phone call from pt husband this AM and husband communicated that the patient is in the hospital. Pt husband became choked up on the line and stated, "My wife is in the ICU with pneumonia and I need your advice." Communicated voicemail message to Dr. Irene Limbo who plans to visit pt and husband in the hospital this afternoon.

## 2017-03-26 NOTE — Progress Notes (Signed)
ABG results called to Dr Titus Mould 7.29 38 88 bicarb 18.4.orders to repeat at 2200

## 2017-03-26 NOTE — Progress Notes (Signed)
Dr Titus Mould called with patient's decreased blood pressure despite increasing levophed and decreased urine output .orders received

## 2017-03-27 ENCOUNTER — Inpatient Hospital Stay (HOSPITAL_COMMUNITY): Payer: Medicaid Other

## 2017-03-27 DIAGNOSIS — M7989 Other specified soft tissue disorders: Secondary | ICD-10-CM

## 2017-03-27 LAB — URINE CULTURE

## 2017-03-27 LAB — CBC WITH DIFFERENTIAL/PLATELET
Basophils Absolute: 0 10*3/uL (ref 0.0–0.1)
Basophils Relative: 0 %
Eosinophils Absolute: 0 10*3/uL (ref 0.0–0.7)
Eosinophils Relative: 0 %
HCT: 27.8 % — ABNORMAL LOW (ref 36.0–46.0)
Hemoglobin: 8.9 g/dL — ABNORMAL LOW (ref 12.0–15.0)
Lymphocytes Relative: 73 %
Lymphs Abs: 0.3 10*3/uL — ABNORMAL LOW (ref 0.7–4.0)
MCH: 25.4 pg — ABNORMAL LOW (ref 26.0–34.0)
MCHC: 32 g/dL (ref 30.0–36.0)
MCV: 79.4 fL (ref 78.0–100.0)
Monocytes Absolute: 0 10*3/uL — ABNORMAL LOW (ref 0.1–1.0)
Monocytes Relative: 15 %
Neutro Abs: 0 10*3/uL — ABNORMAL LOW (ref 1.7–7.7)
Neutrophils Relative %: 12 %
Platelets: 15 10*3/uL — CL (ref 150–400)
RBC: 3.5 MIL/uL — ABNORMAL LOW (ref 3.87–5.11)
RDW: 17.3 % — ABNORMAL HIGH (ref 11.5–15.5)
WBC: 0.3 10*3/uL — CL (ref 4.0–10.5)

## 2017-03-27 LAB — LACTIC ACID, PLASMA: Lactic Acid, Venous: 10.4 mmol/L (ref 0.5–1.9)

## 2017-03-27 LAB — BASIC METABOLIC PANEL
ANION GAP: 21 — AB (ref 5–15)
ANION GAP: 21 — AB (ref 5–15)
Anion gap: 22 — ABNORMAL HIGH (ref 5–15)
BUN: 43 mg/dL — AB (ref 6–20)
BUN: 43 mg/dL — ABNORMAL HIGH (ref 6–20)
BUN: 44 mg/dL — ABNORMAL HIGH (ref 6–20)
CALCIUM: 6.3 mg/dL — AB (ref 8.9–10.3)
CHLORIDE: 92 mmol/L — AB (ref 101–111)
CHLORIDE: 91 mmol/L — AB (ref 101–111)
CO2: 14 mmol/L — AB (ref 22–32)
CO2: 14 mmol/L — AB (ref 22–32)
CO2: 15 mmol/L — AB (ref 22–32)
CREATININE: 2.11 mg/dL — AB (ref 0.44–1.00)
Calcium: 6.6 mg/dL — ABNORMAL LOW (ref 8.9–10.3)
Calcium: 6.2 mg/dL — CL (ref 8.9–10.3)
Chloride: 91 mmol/L — ABNORMAL LOW (ref 101–111)
Creatinine, Ser: 2.16 mg/dL — ABNORMAL HIGH (ref 0.44–1.00)
Creatinine, Ser: 2.25 mg/dL — ABNORMAL HIGH (ref 0.44–1.00)
GFR calc Af Amer: 30 mL/min — ABNORMAL LOW (ref >60)
GFR calc Af Amer: 29 mL/min — ABNORMAL LOW (ref >60)
GFR calc Af Amer: 28 mL/min — ABNORMAL LOW (ref >60)
GFR calc non Af Amer: 26 mL/min — ABNORMAL LOW (ref >60)
GFR calc non Af Amer: 25 mL/min — ABNORMAL LOW (ref >60)
GFR calc non Af Amer: 24 mL/min — ABNORMAL LOW (ref >60)
GLUCOSE: 135 mg/dL — AB (ref 65–99)
GLUCOSE: 140 mg/dL — AB (ref 65–99)
Glucose, Bld: 134 mg/dL — ABNORMAL HIGH (ref 65–99)
POTASSIUM: 5.9 mmol/L — AB (ref 3.5–5.1)
POTASSIUM: 5.8 mmol/L — AB (ref 3.5–5.1)
Potassium: 5.7 mmol/L — ABNORMAL HIGH (ref 3.5–5.1)
Sodium: 128 mmol/L — ABNORMAL LOW (ref 135–145)
Sodium: 126 mmol/L — ABNORMAL LOW (ref 135–145)
Sodium: 127 mmol/L — ABNORMAL LOW (ref 135–145)

## 2017-03-27 LAB — COMPREHENSIVE METABOLIC PANEL
ALT: 73 U/L — ABNORMAL HIGH (ref 14–54)
AST: 233 U/L — ABNORMAL HIGH (ref 15–41)
Albumin: 1.4 g/dL — ABNORMAL LOW (ref 3.5–5.0)
Alkaline Phosphatase: 135 U/L — ABNORMAL HIGH (ref 38–126)
Anion gap: 19 — ABNORMAL HIGH (ref 5–15)
BUN: 46 mg/dL — ABNORMAL HIGH (ref 6–20)
CO2: 11 mmol/L — ABNORMAL LOW (ref 22–32)
Calcium: 6.1 mg/dL — CL (ref 8.9–10.3)
Chloride: 93 mmol/L — ABNORMAL LOW (ref 101–111)
Creatinine, Ser: 2.08 mg/dL — ABNORMAL HIGH (ref 0.44–1.00)
GFR calc Af Amer: 30 mL/min — ABNORMAL LOW (ref >60)
GFR calc non Af Amer: 26 mL/min — ABNORMAL LOW (ref >60)
Glucose, Bld: 82 mg/dL (ref 65–99)
Potassium: 6.7 mmol/L (ref 3.5–5.1)
Sodium: 123 mmol/L — ABNORMAL LOW (ref 135–145)
Total Bilirubin: 3.9 mg/dL — ABNORMAL HIGH (ref 0.3–1.2)
Total Protein: 4.4 g/dL — ABNORMAL LOW (ref 6.5–8.1)

## 2017-03-27 LAB — POCT I-STAT 3, ART BLOOD GAS (G3+)
ACID-BASE DEFICIT: 10 mmol/L — AB (ref 0.0–2.0)
Acid-base deficit: 12 mmol/L — ABNORMAL HIGH (ref 0.0–2.0)
BICARBONATE: 15 mmol/L — AB (ref 20.0–28.0)
Bicarbonate: 17.8 mmol/L — ABNORMAL LOW (ref 20.0–28.0)
O2 SAT: 96 %
O2 Saturation: 95 %
PCO2 ART: 46.1 mmHg (ref 32.0–48.0)
PO2 ART: 98 mmHg (ref 83.0–108.0)
PO2 ART: 100 mmHg (ref 83.0–108.0)
TCO2: 19 mmol/L — ABNORMAL LOW (ref 22–32)
TCO2: 16 mmol/L — ABNORMAL LOW (ref 22–32)
pCO2 arterial: 40.1 mmHg (ref 32.0–48.0)
pH, Arterial: 7.195 — CL (ref 7.350–7.450)
pH, Arterial: 7.185 — CL (ref 7.350–7.450)

## 2017-03-27 LAB — CBC
HEMATOCRIT: 22.8 % — AB (ref 36.0–46.0)
Hemoglobin: 7.2 g/dL — ABNORMAL LOW (ref 12.0–15.0)
MCH: 25.3 pg — ABNORMAL LOW (ref 26.0–34.0)
MCHC: 31.6 g/dL (ref 30.0–36.0)
MCV: 80 fL (ref 78.0–100.0)
Platelets: 8 10*3/uL — CL (ref 150–400)
RBC: 2.85 MIL/uL — ABNORMAL LOW (ref 3.87–5.11)
RDW: 17.2 % — AB (ref 11.5–15.5)
WBC: 0.3 10*3/uL — CL (ref 4.0–10.5)

## 2017-03-27 LAB — PROTIME-INR
INR: 2.71
INR: 1.91
PROTHROMBIN TIME: 28.6 s — AB (ref 11.4–15.2)
PROTHROMBIN TIME: 21.7 s — AB (ref 11.4–15.2)

## 2017-03-27 LAB — DIC (DISSEMINATED INTRAVASCULAR COAGULATION) PANEL
D DIMER QUANT: 11.7 ug{FEU}/mL — AB (ref 0.00–0.50)
FIBRINOGEN: 382 mg/dL (ref 210–475)
PLATELETS: 22 10*3/uL — AB (ref 150–400)
SMEAR REVIEW: NONE SEEN

## 2017-03-27 LAB — DIC (DISSEMINATED INTRAVASCULAR COAGULATION)PANEL
INR: 2.34
Prothrombin Time: 25.5 seconds — ABNORMAL HIGH (ref 11.4–15.2)
aPTT: 53 seconds — ABNORMAL HIGH (ref 24–36)

## 2017-03-27 LAB — GLUCOSE, CAPILLARY
GLUCOSE-CAPILLARY: 123 mg/dL — AB (ref 65–99)
Glucose-Capillary: 130 mg/dL — ABNORMAL HIGH (ref 65–99)

## 2017-03-27 LAB — ABO/RH: ABO/RH(D): O POS

## 2017-03-27 LAB — VANCOMYCIN, RANDOM: Vancomycin Rm: 20

## 2017-03-27 LAB — PHOSPHORUS: PHOSPHORUS: 8.8 mg/dL — AB (ref 2.5–4.6)

## 2017-03-27 LAB — MAGNESIUM: Magnesium: 1.9 mg/dL (ref 1.7–2.4)

## 2017-03-27 MED ORDER — DIGOXIN 0.25 MG/ML IJ SOLN
0.5000 mg | Freq: Once | INTRAMUSCULAR | Status: AC
  Administered 2017-03-27: 0.5 mg via INTRAVENOUS
  Filled 2017-03-27: qty 2

## 2017-03-27 MED ORDER — SODIUM CHLORIDE 0.9 % IV SOLN
1.0000 g | Freq: Two times a day (BID) | INTRAVENOUS | Status: DC
  Administered 2017-03-27: 1 g via INTRAVENOUS
  Filled 2017-03-27 (×2): qty 1

## 2017-03-27 MED ORDER — CHLORHEXIDINE GLUCONATE 0.12% ORAL RINSE (MEDLINE KIT): 15.0000 mL | Freq: Two times a day (BID) | OROMUCOSAL | Status: DC

## 2017-03-27 MED ORDER — LORAZEPAM BOLUS VIA INFUSION
2.0000 mg | INTRAVENOUS | Status: DC | PRN
  Filled 2017-03-27: qty 5

## 2017-03-27 MED ORDER — PRISMASOL BGK 0/2.5 32-2.5 MEQ/L IV SOLN
INTRAVENOUS | Status: DC
  Administered 2017-03-27: 12:00:00 via INTRAVENOUS_CENTRAL
  Filled 2017-03-27 (×2): qty 5000

## 2017-03-27 MED ORDER — SODIUM BICARBONATE 8.4 % IV SOLN
INTRAVENOUS | Status: AC
  Administered 2017-03-27: 100 meq via INTRAVENOUS
  Filled 2017-03-27: qty 100

## 2017-03-27 MED ORDER — PRISMASOL BGK 0/2.5 32-2.5 MEQ/L IV SOLN
INTRAVENOUS | Status: DC
  Administered 2017-03-27: 12:00:00 via INTRAVENOUS_CENTRAL
  Filled 2017-03-27 (×3): qty 5000

## 2017-03-27 MED ORDER — SODIUM BICARBONATE 8.4 % IV SOLN
INTRAVENOUS | Status: AC
  Filled 2017-03-27: qty 100

## 2017-03-27 MED ORDER — ORAL CARE MOUTH RINSE
15.0000 mL | Freq: Four times a day (QID) | OROMUCOSAL | Status: DC
Start: 2017-03-27 — End: 2017-03-27

## 2017-03-27 MED ORDER — SODIUM CHLORIDE 0.9 % IV SOLN: Freq: Once | INTRAVENOUS | Status: DC

## 2017-03-27 MED ORDER — SODIUM POLYSTYRENE SULFONATE 15 GM/60ML PO SUSP
30.0000 g | Freq: Once | ORAL | Status: AC
  Administered 2017-03-27: 30 g
  Filled 2017-03-27: qty 120

## 2017-03-27 MED ORDER — SODIUM CHLORIDE 0.9 % IV BOLUS (SEPSIS)
500.0000 mL | Freq: Once | INTRAVENOUS | Status: AC
  Administered 2017-03-27: 500 mL via INTRAVENOUS

## 2017-03-27 MED ORDER — SODIUM CHLORIDE 0.9 % IV SOLN
1.0000 g | Freq: Once | INTRAVENOUS | Status: DC
  Filled 2017-03-27: qty 10

## 2017-03-27 MED ORDER — LORAZEPAM 2 MG/ML IJ SOLN
5.0000 mg/h | INTRAVENOUS | Status: DC
  Administered 2017-03-27: 5 mg/h via INTRAVENOUS
  Filled 2017-03-27: qty 25

## 2017-03-27 MED ORDER — CALCIUM CHLORIDE 10 % IV SOLN
1.0000 g | Freq: Once | INTRAVENOUS | Status: AC
  Administered 2017-03-27: 1 g via INTRAVENOUS
  Filled 2017-03-27: qty 10

## 2017-03-27 MED ORDER — CALCIUM GLUCONATE 10 % IV SOLN
1.0000 g | Freq: Once | INTRAVENOUS | Status: AC
  Administered 2017-03-27: 1 g via INTRAVENOUS
  Filled 2017-03-27: qty 10

## 2017-03-27 MED ORDER — MORPHINE SULFATE 25 MG/ML IV SOLN
10.0000 mg/h | INTRAVENOUS | Status: DC
  Administered 2017-03-27: 10 mg/h via INTRAVENOUS
  Filled 2017-03-27: qty 10

## 2017-03-27 MED ORDER — SODIUM POLYSTYRENE SULFONATE 15 GM/60ML PO SUSP
100.0000 g | Freq: Once | ORAL | Status: AC
  Administered 2017-03-27: 100 g via RECTAL
  Filled 2017-03-27: qty 400

## 2017-03-27 MED ORDER — INSULIN ASPART 100 UNIT/ML IV SOLN
10.0000 [IU] | Freq: Once | INTRAVENOUS | Status: AC
  Administered 2017-03-27: 10 [IU] via INTRAVENOUS

## 2017-03-27 MED ORDER — SODIUM CHLORIDE 0.9 % IJ SOLN: 250.0000 [IU]/h | INTRAMUSCULAR | Status: DC

## 2017-03-27 MED ORDER — WHITE PETROLATUM EX OINT
TOPICAL_OINTMENT | CUTANEOUS | Status: AC
  Administered 2017-03-27: 15:00:00
  Filled 2017-03-27: qty 28.35

## 2017-03-27 MED ORDER — SODIUM BICARBONATE 8.4 % IV SOLN
INTRAVENOUS | Status: DC
  Administered 2017-03-27 (×2): via INTRAVENOUS
  Filled 2017-03-27 (×4): qty 150

## 2017-03-27 MED ORDER — SODIUM BICARBONATE 8.4 % IV SOLN
100.0000 meq | Freq: Once | INTRAVENOUS | Status: AC
Start: 2017-03-27 — End: 2017-03-27
  Administered 2017-03-27: 100 meq via INTRAVENOUS
  Filled 2017-03-27: qty 100

## 2017-03-27 MED ORDER — SODIUM BICARBONATE 8.4 % IV SOLN
100.0000 meq | Freq: Once | INTRAVENOUS | Status: AC
  Administered 2017-03-27: 100 meq via INTRAVENOUS
  Filled 2017-03-27: qty 100

## 2017-03-27 MED ORDER — VITAMIN K1 10 MG/ML IJ SOLN
10.0000 mg | Freq: Once | INTRAVENOUS | Status: AC
  Administered 2017-03-27: 10 mg via INTRAVENOUS
  Filled 2017-03-27: qty 1

## 2017-03-27 MED ORDER — DEXTROSE 50 % IV SOLN
50.0000 mL | Freq: Once | INTRAVENOUS | Status: AC
Start: 2017-03-27 — End: 2017-03-27
  Administered 2017-03-27: 50 mL via INTRAVENOUS
  Filled 2017-03-27: qty 50

## 2017-03-27 MED ORDER — PRISMASOL BGK 0/2.5 32-2.5 MEQ/L IV SOLN
INTRAVENOUS | Status: DC
  Administered 2017-03-27: 12:00:00 via INTRAVENOUS_CENTRAL
  Filled 2017-03-27 (×6): qty 5000

## 2017-03-27 MED ORDER — SODIUM CHLORIDE 0.9 % FOR CRRT
INTRAVENOUS_CENTRAL | Status: DC | PRN
  Filled 2017-03-27: qty 1000

## 2017-03-27 MED ORDER — SODIUM POLYSTYRENE SULFONATE 15 GM/60ML PO SUSP
100.0000 g | Freq: Once | ORAL | Status: AC
Start: 2017-03-27 — End: 2017-03-27
  Administered 2017-03-27: 100 g via RECTAL
  Filled 2017-03-27: qty 400

## 2017-03-27 MED ORDER — SODIUM BICARBONATE 8.4 % IV SOLN
100.0000 meq | Freq: Once | INTRAVENOUS | Status: AC
  Administered 2017-03-27: 100 meq via INTRAVENOUS

## 2017-03-27 MED ORDER — HEPARIN BOLUS VIA INFUSION (CRRT): 1000.0000 [IU] | INTRAVENOUS | Status: DC | PRN

## 2017-03-27 MED ORDER — HEPARIN SODIUM (PORCINE) 1000 UNIT/ML DIALYSIS: 1000.0000 [IU] | INTRAMUSCULAR | Status: DC | PRN

## 2017-03-27 MED ORDER — MORPHINE BOLUS VIA INFUSION
5.0000 mg | INTRAVENOUS | Status: DC | PRN
  Filled 2017-03-27: qty 20

## 2017-03-28 LAB — BLOOD CULTURE ID PANEL (REFLEXED)
Acinetobacter baumannii: NOT DETECTED
CANDIDA GLABRATA: NOT DETECTED
CANDIDA KRUSEI: NOT DETECTED
Candida albicans: NOT DETECTED
Candida parapsilosis: NOT DETECTED
Candida tropicalis: NOT DETECTED
ENTEROCOCCUS SPECIES: NOT DETECTED
Enterobacter cloacae complex: NOT DETECTED
Enterobacteriaceae species: NOT DETECTED
Escherichia coli: NOT DETECTED
Haemophilus influenzae: NOT DETECTED
Klebsiella oxytoca: NOT DETECTED
Klebsiella pneumoniae: NOT DETECTED
LISTERIA MONOCYTOGENES: NOT DETECTED
Neisseria meningitidis: NOT DETECTED
PROTEUS SPECIES: NOT DETECTED
PSEUDOMONAS AERUGINOSA: NOT DETECTED
STAPHYLOCOCCUS SPECIES: NOT DETECTED
STREPTOCOCCUS PNEUMONIAE: NOT DETECTED
STREPTOCOCCUS PYOGENES: NOT DETECTED
Serratia marcescens: NOT DETECTED
Staphylococcus aureus (BCID): NOT DETECTED
Streptococcus agalactiae: NOT DETECTED
Streptococcus species: NOT DETECTED

## 2017-03-28 LAB — BPAM PLATELET PHERESIS
Blood Product Expiration Date: 201810232359
ISSUE DATE / TIME: 201810230549
UNIT TYPE AND RH: 5100

## 2017-03-28 LAB — PREPARE PLATELET PHERESIS: Unit division: 0

## 2017-03-28 LAB — PREPARE FRESH FROZEN PLASMA
UNIT DIVISION: 0
Unit division: 0
Unit division: 0
Unit division: 0

## 2017-03-28 LAB — CULTURE, BLOOD (ROUTINE X 2): SPECIAL REQUESTS: ADEQUATE

## 2017-03-28 LAB — BPAM FFP
BLOOD PRODUCT EXPIRATION DATE: 201810262359
BLOOD PRODUCT EXPIRATION DATE: 201810262359
BLOOD PRODUCT EXPIRATION DATE: 201810262359
BLOOD PRODUCT EXPIRATION DATE: 201810262359
ISSUE DATE / TIME: 201810230549
ISSUE DATE / TIME: 201810230549
ISSUE DATE / TIME: 201810230549
ISSUE DATE / TIME: 201810230549
UNIT TYPE AND RH: 7300
Unit Type and Rh: 7300
Unit Type and Rh: 7300
Unit Type and Rh: 7300

## 2017-03-28 LAB — POCT I-STAT 3, ART BLOOD GAS (G3+)
ACID-BASE DEFICIT: 11 mmol/L — AB (ref 0.0–2.0)
BICARBONATE: 16.4 mmol/L — AB (ref 20.0–28.0)
O2 Saturation: 94 %
PH ART: 7.182 — AB (ref 7.350–7.450)
Patient temperature: 99.2
TCO2: 18 mmol/L — ABNORMAL LOW (ref 22–32)
pCO2 arterial: 44 mmHg (ref 32.0–48.0)
pO2, Arterial: 87 mmHg (ref 83.0–108.0)

## 2017-03-28 LAB — PATHOLOGIST SMEAR REVIEW

## 2017-03-28 LAB — GLUCOSE, CAPILLARY: Glucose-Capillary: 79 mg/dL (ref 65–99)

## 2017-03-29 ENCOUNTER — Other Ambulatory Visit: Payer: Self-pay

## 2017-03-29 ENCOUNTER — Ambulatory Visit: Payer: Self-pay | Admitting: Hematology

## 2017-03-29 ENCOUNTER — Telehealth: Payer: Self-pay

## 2017-03-29 NOTE — Telephone Encounter (Signed)
On 03/29/17 I received a d/c from [employer]. (original) The d/c is for cremation. The patient is a patient of Doctor Titus Mould. The d/c will be taken to Pulmonary Unit tomorrow am to see if Doctor Lake Bells can sign the d/c since Doctor Titus Mould is on vacation.  On 04/02/17  I received the d/c back from Doctor Lake Bells who signed the d/c for Doctor Titus Mould. I got the d/c ready and called the funeral home to let them know the d/c is ready for pickup. I also faxed a copy to the funeral home per the funeral home request.

## 2017-03-30 LAB — CULTURE, BLOOD (ROUTINE X 2)
SPECIAL REQUESTS: ADEQUATE
Special Requests: ADEQUATE

## 2017-03-31 LAB — CULTURE, BLOOD (ROUTINE X 2)
CULTURE: NO GROWTH
SPECIAL REQUESTS: ADEQUATE

## 2017-04-05 NOTE — Progress Notes (Signed)
CRRT stopped and blood returned per comfort care

## 2017-04-05 NOTE — Progress Notes (Signed)
Critical ABG results hand delivered to Dr Titus Mould. Decreased peep to 5 (from 8) but no other changes at this time. RT will continue to monitor.

## 2017-04-05 NOTE — Progress Notes (Signed)
**  Preliminary report by tech**  Bilateral lower extremity venous duplex completed. There is no obvious evidence of deep or superficial vein thrombosis involving the right and left lower extremities. All clearly visualized vessels appear patent and compressible.  There is evidence of a Baker's cyst measuring 1.3 cm high by 2.4 cm wide by greater than 4.6 cm long on the right. There is no evidence of a Baker's cyst on the left. Results were given to the patient's nurse, Caryl Pina.  04-15-17 12:21 PM Marisa Kim RVT

## 2017-04-05 NOTE — Progress Notes (Signed)
CRITICAL VALUE ALERT  Critical Value: Plt 15   Date & Time Notied:  11-Apr-2017 0350  Provider Notified: Dr Elsworth Soho  Orders Received/Actions taken: Md notified, no new orders at this time

## 2017-04-05 NOTE — Progress Notes (Signed)
Patient ID: Marisa Kim, female   DOB: December 19, 1965, 51 y.o.   MRN: [id number] Extensive discussion with family husband. We discussed the poor prognosis and likely poor quality of life. Family has decided to offer full comfort care. They are aware that the patient may be transferred to palliative care floor for continued comfort care needs. They have been fully updated on the process and expectations. I explained possible revversible still. She would not want  Lavon Paganini. Titus Mould, MD, [place] Pgr: [facility]

## 2017-04-05 NOTE — Progress Notes (Signed)
Pharmacy Antibiotic Note  Marisa Kim is a 51 y.o. female with a past medical history significant for recently diagnosed R lung cancer with mets to the liver (on carbo/taxol) also with recent hospital admission at the end of September who presented on 03/19/2017 with sepsis 2/2 HCAP and neutropenia after having her first round of chemo last Monday. She was initially treated with cefepime, but due to lack of clinical progress, coverage was broadened to meropenem. Blood cultures grew  E. Coli, Klebsiella pneumoniae, and Klebsiella oxytoca.   Today, the patient continues to clinically decline. She remains severely neutropenic, hypotensive and has developed fevers. She had worsening renal function and was producing little urine. CRRT was initiated at 1200 on 10/23. With growth of gram negative organisms only, vancomycin to be discontinued.   Plan: Discontinue Vancomycin  Change meropenem to 1 g q12h Follow renal function, CRRT plans, and clinical course F/u blood cx and susceptibilities  Height: 5' (152.4 cm) Weight: 204 lb 2.3 oz (92.6 kg) IBW/kg (Calculated) : 45.5  Temp (24hrs), Avg:98.7 F (37.1 C), Min:98 F (36.7 C), Max:100.3 F (37.9 C)   Recent Labs Lab 03/22/17 1930 03/08/2017 1853  03/26/17 0630 03/26/17 1251  03/26/17 1847 03/26/17 2115 03/26/17 2127 Apr 17, 2017 0327 17-Apr-2017 0730 2017-04-17 1049 04-17-17 1138  WBC 21.1* 0.2*  --  0.3*  --   --   --   --   --  0.3* 0.3*  --   --   CREATININE 0.99 1.63*  --  1.53*  --   < >  --  1.80*  --  2.08* 2.11* 2.16* 2.25*  LATICACIDVEN  --   --   < > 6.0* 6.4*  --  5.4*  --  4.8*  --   --  10.4*  --   VANCORANDOM  --   --   --   --   --   --   --   --   --   --   --   --  20  < > = values in this interval not displayed.  Estimated Creatinine Clearance: 29.7 mL/min (A) (by C-G formula based on SCr of 2.25 mg/dL (H)).    No Known Allergies  Antimicrobials this admission: Meropenem 10/22 >> Vancomycin 10/21 >> Cefepime 10/21 >>  10/22   Dose adjustments this admission: Vancomycin 1 g q24 to 750 mg q12 (d/c'd) Due to CRRT, meropenem changed to 1 g q12h  Microbiology results: 10/21 BCx: susceptibilities pending 10/21 BCID: Klebsiella pneumoniae, Klebsiella oxytoca, E. Coli  10/21 UCx: multiple species present 10/22 MRSA PCR: negative  Thank you for allowing pharmacy to be a part of this patient's care.  Sallyanne Havers, PharmD Candidate 2017-04-17 2:25 PM   I discussed / reviewed the pharmacy note by Ms. Dunn, PharmD Candidate and I agree with the student's findings and plans as documented.  Sloan Leiter, PharmD, BCPS Clinical Pharmacist Clinical phone 04/17/17 until 3:[phone] After hours, please call [phone] Apr 17, 2017, 2:46 PM

## 2017-04-05 NOTE — Progress Notes (Signed)
Called emergently to bedside to evaluate unstable patient. Had wide complex rhythm prior to my arrival that resolved with Bicarb. At time of my exam, MAP 80's, HR 120's, Rhonchi on exam, Abdomen distended and hard, 2+ Diffuse Anasarca, Abdominal pressure 32. No UOP. Blood-tinged ETT secretions. Levo @ 40, Angiotensin @ 40, Vaso 0.03. CVP 19. Hyperkalemic at 6.7, Bicarb has dropped from 17 to 11, pH now 7.18 with pCO2 43. Already received Calcium, Bicarb gtt, Kayexylate rectally. Renal has been called will come by at 7am to see patient. Not sure if she will tolerate CRRT on 3 vasopressors. Has severe thrombocytopenia (Plt 15) and also has a coagulopathy (INR 2.71). Will check DIC panel. Will give FFP, Vit K, and Platelets. Then will plan to place an HD catheter if Renal think she is a dialysis candidate. For her septic shock, she is already on Vanc and Mero as well as stress dose steroids. Blood cultures growing Enterobacter, Ecoli, and Klebsiella; Will repeat blood cultures now. Consider ID consult in AM. Family is not at bedside presently.   60 minutes critical care time  Marisa Murders, MD Pulmonary & Critical Care Medicine Pager: [phone]

## 2017-04-05 NOTE — Progress Notes (Signed)
Morphine 240 mls wasted and 45 mls ativan wasted and witnessed by Heide Guile RN

## 2017-04-05 NOTE — Consult Note (Signed)
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 Patient was seen in the ED for shortness of breath on 10/18 and given breathing treatments and had negative CT angiogram.  According to records, she worsened, developed orthopnea, edema, abd distension and diarrhea,  She presented to [facility] ED and subsequently was intubated and transferred to [facility]. Admission CT scan revealed patchy airspace disease and mets to lung and liver and mild intestinal wall thickening of descending and transverse colon wall.  Blood cultures are growing enterobacter, e. Coli, klebsiella o, and  Klebsiella p.  Of note, there is a pmx of cervical cancer (1980's), and pulmonary stenosis as a child s/p open heart surgery. Renal was asked to see due to hyperkalemia to 6.7, acidosis to pH 7.1 and apparently there was a wide complex junctional rhythm.   She was treated with bicarb and calcium. Patient is in shock and on multiple pressors.  Creat is 2.08 and was 0.56 on 03/08/17.  She is oliguric.  Past Medical History:  Diagnosis Date  . Cervical ca ([place])   . Pulmonary stenosis    Past Surgical History:  Procedure Laterality Date  . CARDIAC SURGERY    . CERVIX SURGERY     30 years ago   Social History:  reports that she quit smoking about 7 years ago. She has a 60.00 pack-year smoking history. She has never used smokeless tobacco. She reports that she does not drink alcohol or use drugs. Allergies: No Known Allergies Family History  Problem Relation Age of Onset  . Lung cancer Mother 62  . Emphysema Father     Medications:  Prior to Admission:  Prescriptions Prior to Admission  Medication Sig Dispense Refill Last Dose  . dexamethasone (DECADRON) 4 MG tablet Take 2 tablets (8 mg total) by mouth daily. Start the day after chemotherapy for 2 days. 30 tablet 1 03/21/2017 at Unknown time  .  LORazepam (ATIVAN) 0.5 MG tablet Take 1 tablet (0.5 mg total) by mouth every 6 (six) hours as needed (Nausea or vomiting). 30 tablet 0 03/23/2017 at Unknown time  . morphine (MS CONTIN) 60 MG 12 hr tablet Take 1 tablet (60 mg total) by mouth every 12 (twelve) hours. 60 tablet 0 03/14/2017 at 0600  . ondansetron (ZOFRAN) 8 MG tablet Take 1 tablet (8 mg total) by mouth 2 (two) times daily as needed for refractory nausea / vomiting. Start on day 3 after chemo. 30 tablet 1 03/09/2017 at Unknown time  . prochlorperazine (COMPAZINE) 10 MG tablet Take 1 tablet (10 mg total) by mouth every 6 (six) hours as needed (Nausea or vomiting). 30 tablet 1 03/14/2017 at Unknown time  . oxyCODONE 10 MG TABS Take 1 tablet (10 mg total) by mouth every 4 (four) hours as needed for moderate pain. 60 tablet 0 Taking   Scheduled: . chlorhexidine gluconate (MEDLINE KIT)  15 mL Mouth Rinse BID  . feeding supplement (PRO-STAT SUGAR FREE 64)  30 mL Per Tube TID BM  . feeding supplement (VITAL HIGH PROTEIN)  1,000 mL Per Tube Q24H  . fludrocortisone  0.1 mg Oral Daily  . hydrocortisone sod succinate (SOLU-CORTEF) inj  50 mg Intravenous Q6H  . ipratropium-albuterol  3 mL Nebulization Q6H  . mouth rinse  15 mL Mouth Rinse 10 times per day  . pantoprazole (PROTONIX) IV  40 mg Intravenous Daily  . Tbo-Filgrastim  480 mcg Subcutaneous q1800   Continuous: . sodium chloride    . sodium chloride    . sodium chloride Stopped (2017/04/01 0543)  . angiotensin II (GIAPREZA) infusion 39.96 ng/kg/min (Apr 01, 2017 0100)  . fentaNYL infusion INTRAVENOUS 200 mcg/hr (2017-04-01 0051)  . meropenem (MERREM) IV Stopped (03/26/17 2148)  . norepinephrine (LEVOPHED) Adult infusion 40 mcg/min (April 01, 2017 0251)  .  sodium bicarbonate  infusion 1000 mL 125 mL/hr at 2017-04-01 0542  . vasopressin (PITRESSIN) infusion - *FOR SHOCK* 0.03 Units/min (04/01/17 0314)    ROS: not obtainable Blood pressure (!) 151/76, pulse (!) 118, temperature 98.7 F (37.1  C), temperature source Oral, resp. rate 13, height 5' (1.524 m), weight 92.6 kg (204 lb 2.3 oz), SpO2 97 %.  General appearance: not reponsive, anasarca, obese Head: Normocephalic, without obvious abnormality, atraumatic, facial swelling Throat: oral intubation Resp: vented sounds Chest wall: NT GI: protuberant firm, no response to palpation Extremities: edema 2+, acral cyanosis of toes Skin:Toes distal cyanosis Neurologic: Unresponsive Results for orders placed or performed during the hospital encounter of 03/17/2017 (from the past 48 hour(s))  CBC WITH DIFFERENTIAL     Status: Abnormal   Collection Time: 03/09/2017  6:53 PM  Result Value Ref Range   WBC 0.2 (LL) 4.0 - 10.5 K/uL    Comment: REPEATED TO VERIFY CRITICAL RESULT CALLED TO, READ BACK BY AND VERIFIED WITH: A MARQUEZ,RN '@[id number]'$  03/20/2017 MKELLY    RBC 4.00 3.87 - 5.11 MIL/uL   Hemoglobin 10.3 (L) 12.0 - 15.0 g/dL   HCT 30.8 (L) 36.0 - 46.0 %   MCV 77.0 (L) 78.0 - 100.0 fL   MCH 25.8 (L) 26.0 - 34.0 pg   MCHC 33.4 30.0 - 36.0 g/dL   RDW 16.7 (H) 11.5 - 15.5 %   Platelets 73 (L) 150 - 400 K/uL    Comment: SPECIMEN CHECKED FOR CLOTS PLATELET COUNT CONFIRMED BY SMEAR    Neutrophils Relative % 42 %   Lymphocytes Relative 53 %   Monocytes Relative 5 %   Eosinophils Relative 0 %   Basophils Relative 0 %   Neutro Abs 0.1 (L) 1.7 - 7.7 K/uL   Lymphs Abs 0.1 (L) 0.7 - 4.0 K/uL   Monocytes Absolute 0.0 (L) 0.1 - 1.0 K/uL   Eosinophils Absolute 0.0 0.0 - 0.7 K/uL   Basophils Absolute 0.0 0.0 - 0.1 K/uL   WBC Morphology TOO FEW TO COUNT, SMEAR AVAILABLE FOR REVIEW   Comprehensive metabolic panel     Status: Abnormal   Collection Time: 03/15/2017  6:53 PM  Result Value Ref Range   Sodium 126 (L) 135 - 145 mmol/L   Potassium 4.8 3.5 - 5.1 mmol/L   Chloride 83 (L) 101 - 111 mmol/L   CO2 23 22 - 32 mmol/L   Glucose, Bld 59 (L) 65 - 99 mg/dL   BUN 47 (H) 6 - 20 mg/dL   Creatinine, Ser 1.63 (H) 0.44 - 1.00 mg/dL   Calcium 7.8 (L)  8.9 - 10.3 mg/dL   Total Protein 6.0 (L) 6.5 - 8.1 g/dL   Albumin 2.2 (L) 3.5 - 5.0 g/dL   AST 175 (H) 15 - 41 U/L   ALT 73 (H) 14 - 54 U/L   Alkaline Phosphatase 210 (H) 38 - 126 U/L   Total Bilirubin 3.6 (H) 0.3 - 1.2 mg/dL   GFR calc non Af Amer 35 (L) >60 mL/min   GFR calc Af Amer 41 (L) >60 mL/min    Comment: (NOTE) The eGFR has  been calculated using the CKD EPI equation. This calculation has not been validated in all clinical situations. eGFR's persistently <60 mL/min signify possible Chronic Kidney Disease.    Anion gap 20 (H) 5 - 15  Blood Culture (routine x 2)     Status: None (Preliminary result)   Collection Time: 03/28/2017  6:53 PM  Result Value Ref Range   Specimen Description BLOOD RIGHT ANTECUBITAL    Special Requests      BOTTLES DRAWN AEROBIC AND ANAEROBIC Blood Culture adequate volume   Culture  Setup Time      GRAM NEGATIVE RODS AEROBIC BOTTLE ONLY Organism ID to follow CRITICAL RESULT CALLED TO, READ BACK BY AND VERIFIED WITH: MVonita Moss.D. 14:30 03/26/17 (wilsonm)    Culture GRAM NEGATIVE RODS    Report Status PENDING   Brain natriuretic peptide     Status: Abnormal   Collection Time: 03/26/2017  6:53 PM  Result Value Ref Range   B Natriuretic Peptide 242.7 (H) 0.0 - 100.0 pg/mL  Blood Culture ID Panel (Reflexed)     Status: Abnormal   Collection Time: 03/30/2017  6:53 PM  Result Value Ref Range   Enterococcus species NOT DETECTED NOT DETECTED   Listeria monocytogenes NOT DETECTED NOT DETECTED   Staphylococcus species NOT DETECTED NOT DETECTED   Staphylococcus aureus NOT DETECTED NOT DETECTED   Streptococcus species NOT DETECTED NOT DETECTED   Streptococcus agalactiae NOT DETECTED NOT DETECTED   Streptococcus pneumoniae NOT DETECTED NOT DETECTED   Streptococcus pyogenes NOT DETECTED NOT DETECTED   Acinetobacter baumannii NOT DETECTED NOT DETECTED   Enterobacteriaceae species DETECTED (A) NOT DETECTED    Comment: CRITICAL RESULT CALLED TO, READ BACK  BY AND VERIFIED WITH: MVonita Moss.D. 14:30 03/26/17 (wilsonm)    Enterobacter cloacae complex NOT DETECTED NOT DETECTED   Escherichia coli DETECTED (A) NOT DETECTED    Comment: CRITICAL RESULT CALLED TO, READ BACK BY AND VERIFIED WITH: M. Radford Pax Pharm.D. 14:30 03/26/17 (wilsonm)    Klebsiella oxytoca DETECTED (A) NOT DETECTED    Comment: CRITICAL RESULT CALLED TO, READ BACK BY AND VERIFIED WITH: M. Radford Pax Pharm.D. 14:30 03/26/17 (wilsonm)    Klebsiella pneumoniae DETECTED (A) NOT DETECTED    Comment: CRITICAL RESULT CALLED TO, READ BACK BY AND VERIFIED WITH: M. Radford Pax Pharm.D. 14:30 03/26/17 (wilsonm)    Proteus species NOT DETECTED NOT DETECTED   Serratia marcescens NOT DETECTED NOT DETECTED   Carbapenem resistance NOT DETECTED NOT DETECTED   Haemophilus influenzae NOT DETECTED NOT DETECTED   Neisseria meningitidis NOT DETECTED NOT DETECTED   Pseudomonas aeruginosa NOT DETECTED NOT DETECTED   Candida albicans NOT DETECTED NOT DETECTED   Candida glabrata NOT DETECTED NOT DETECTED   Candida krusei NOT DETECTED NOT DETECTED   Candida parapsilosis NOT DETECTED NOT DETECTED   Candida tropicalis NOT DETECTED NOT DETECTED    Comment: Performed at [facility], [place] [address] [place], [place] [address]  I-Stat CG4 Lactic Acid, ED  (not at  [facility])     Status: Abnormal   Collection Time: 03/31/2017  7:08 PM  Result Value Ref Range   Lactic Acid, Venous 7.34 (HH) 0.5 - 1.9 mmol/L   Comment NOTIFIED PHYSICIAN   I-stat troponin, ED (not at [facility])     Status: None   Collection Time: 04/02/2017  7:16 PM  Result Value Ref Range   Troponin i, poc 0.01 0.00 - 0.08 ng/mL   Comment 3  Comment: Due to the release kinetics of cTnI, a negative result within the first hours of the onset of symptoms does not rule out myocardial infarction with certainty. If myocardial infarction is still suspected, repeat the test at appropriate intervals.   I-Stat CG4 Lactic Acid, ED  (not  at  [facility])     Status: Abnormal   Collection Time: 03/08/2017 10:17 PM  Result Value Ref Range   Lactic Acid, Venous 6.72 (HH) 0.5 - 1.9 mmol/L   Comment NOTIFIED PHYSICIAN   Draw ABG 1 hour after initiation of ventilator     Status: Abnormal   Collection Time: 03/23/2017 11:30 PM  Result Value Ref Range   FIO2 50.00    Delivery systems VENTILATOR    Mode PRESSURE REGULATED VOLUME CONTROL    VT 370 mL   LHR 16 resp/min   Peep/cpap 5.0 cm H20   pH, Arterial 7.141 (LL) 7.350 - 7.450    Comment: CRITICAL RESULT CALLED TO, READ BACK BY AND VERIFIED WITH:  DR.ISSACS, MD AT 2340 BY JESSICA NEUGENT,RRT,RCP ON 03/21/2017    pCO2 arterial 60.4 (H) 32.0 - 48.0 mmHg   pO2, Arterial 107 83.0 - 108.0 mmHg   Bicarbonate 19.7 (L) 20.0 - 28.0 mmol/L   Acid-base deficit 9.1 (H) 0.0 - 2.0 mmol/L   O2 Saturation 94.3 %   Patient temperature 98.6    Collection site LEFT RADIAL    Drawn by [id number]    Sample type ARTERIAL DRAW    Allens test (pass/fail) PASS PASS  Glucose, capillary     Status: None   Collection Time: 03/26/17 12:50 AM  Result Value Ref Range   Glucose-Capillary 74 65 - 99 mg/dL   Comment 1 Notify RN   Urinalysis, Complete w Microscopic     Status: Abnormal   Collection Time: 03/26/17  1:18 AM  Result Value Ref Range   Color, Urine AMBER (A) YELLOW    Comment: BIOCHEMICALS MAY BE AFFECTED BY COLOR   APPearance HAZY (A) CLEAR   Specific Gravity, Urine 1.015 1.005 - 1.030   pH 5.0 5.0 - 8.0   Glucose, UA NEGATIVE NEGATIVE mg/dL   Hgb urine dipstick SMALL (A) NEGATIVE   Bilirubin Urine NEGATIVE NEGATIVE   Ketones, ur NEGATIVE NEGATIVE mg/dL   Protein, ur 30 (A) NEGATIVE mg/dL   Nitrite NEGATIVE NEGATIVE   Leukocytes, UA NEGATIVE NEGATIVE   RBC / HPF 0-5 0 - 5 RBC/hpf   WBC, UA 6-30 0 - 5 WBC/hpf   Bacteria, UA RARE (A) NONE SEEN   Squamous Epithelial / LPF 0-5 (A) NONE SEEN   Mucus PRESENT    Hyaline Casts, UA PRESENT    Granular Casts, UA PRESENT   Sodium, urine, random      Status: None   Collection Time: 03/26/17  1:18 AM  Result Value Ref Range   Sodium, Ur 42 mmol/L  Osmolality, urine     Status: None   Collection Time: 03/26/17  1:18 AM  Result Value Ref Range   Osmolality, Ur 377 300 - 900 mOsm/kg  MRSA PCR Screening     Status: None   Collection Time: 03/26/17  1:20 AM  Result Value Ref Range   MRSA by PCR NEGATIVE NEGATIVE    Comment:        The GeneXpert MRSA Assay (FDA approved for NASAL specimens only), is one component of a comprehensive MRSA colonization surveillance program. It is not intended to diagnose MRSA infection nor to guide or monitor treatment for  MRSA infections.   Triglycerides     Status: None   Collection Time: 03/26/17  4:16 AM  Result Value Ref Range   Triglycerides 147 <150 mg/dL  TSH     Status: None   Collection Time: 03/26/17  6:30 AM  Result Value Ref Range   TSH 0.484 0.350 - 4.500 uIU/mL    Comment: Performed by a 3rd Generation assay with a functional sensitivity of <=0.01 uIU/mL.  Amylase     Status: None   Collection Time: 03/26/17  6:30 AM  Result Value Ref Range   Amylase 33 28 - 100 U/L  Brain natriuretic peptide     Status: Abnormal   Collection Time: 03/26/17  6:30 AM  Result Value Ref Range   B Natriuretic Peptide 1,326.3 (H) 0.0 - 100.0 pg/mL  CBC with Differential/Platelet     Status: Abnormal   Collection Time: 03/26/17  6:30 AM  Result Value Ref Range   WBC 0.3 (LL) 4.0 - 10.5 K/uL    Comment: REPEATED TO VERIFY WHITE COUNT CONFIRMED ON SMEAR CRITICAL RESULT CALLED TO, READ BACK BY AND VERIFIED WITH: P MCKINNEY,RN 0902 03/26/17 D BRADLEY    RBC 3.63 (L) 3.87 - 5.11 MIL/uL   Hemoglobin 9.3 (L) 12.0 - 15.0 g/dL   HCT 28.5 (L) 36.0 - 46.0 %   MCV 78.5 78.0 - 100.0 fL   MCH 25.6 (L) 26.0 - 34.0 pg   MCHC 32.6 30.0 - 36.0 g/dL   RDW 16.8 (H) 11.5 - 15.5 %   Platelets 45 (L) 150 - 400 K/uL    Comment: REPEATED TO VERIFY SPECIMEN CHECKED FOR CLOTS PLATELET COUNT CONFIRMED BY SMEAR     Neutrophils Relative % TOO FEW TO COUNT, SMEAR AVAILABLE FOR REVIEW %   Neutro Abs TOO FEW TO COUNT, SMEAR AVAILABLE FOR REVIEW 1.7 - 7.7 K/uL   Lymphocytes Relative TOO FEW TO COUNT, SMEAR AVAILABLE FOR REVIEW %   Lymphs Abs TOO FEW TO COUNT, SMEAR AVAILABLE FOR REVIEW 0.7 - 4.0 K/uL   Monocytes Relative TOO FEW TO COUNT, SMEAR AVAILABLE FOR REVIEW %   Monocytes Absolute TOO FEW TO COUNT, SMEAR AVAILABLE FOR REVIEW 0.1 - 1.0 K/uL   Eosinophils Relative TOO FEW TO COUNT, SMEAR AVAILABLE FOR REVIEW %   Eosinophils Absolute TOO FEW TO COUNT, SMEAR AVAILABLE FOR REVIEW 0.0 - 0.7 K/uL   Basophils Relative TOO FEW TO COUNT, SMEAR AVAILABLE FOR REVIEW %   Basophils Absolute TOO FEW TO COUNT, SMEAR AVAILABLE FOR REVIEW 0.0 - 0.1 K/uL   RBC Morphology TEARDROP CELLS   Comprehensive metabolic panel     Status: Abnormal   Collection Time: 03/26/17  6:30 AM  Result Value Ref Range   Sodium 122 (L) 135 - 145 mmol/L   Potassium 5.0 3.5 - 5.1 mmol/L   Chloride 90 (L) 101 - 111 mmol/L   CO2 17 (L) 22 - 32 mmol/L   Glucose, Bld 74 65 - 99 mg/dL   BUN 39 (H) 6 - 20 mg/dL   Creatinine, Ser 1.53 (H) 0.44 - 1.00 mg/dL   Calcium 6.6 (L) 8.9 - 10.3 mg/dL   Total Protein 5.2 (L) 6.5 - 8.1 g/dL   Albumin 1.7 (L) 3.5 - 5.0 g/dL   AST 157 (H) 15 - 41 U/L   ALT 65 (H) 14 - 54 U/L   Alkaline Phosphatase 150 (H) 38 - 126 U/L   Total Bilirubin 3.2 (H) 0.3 - 1.2 mg/dL   GFR calc non Af Wyvonnia Lora  38 (L) >60 mL/min   GFR calc Af Amer 44 (L) >60 mL/min    Comment: (NOTE) The eGFR has been calculated using the CKD EPI equation. This calculation has not been validated in all clinical situations. eGFR's persistently <60 mL/min signify possible Chronic Kidney Disease.    Anion gap 15 5 - 15  Lactic acid, plasma     Status: Abnormal   Collection Time: 03/26/17  6:30 AM  Result Value Ref Range   Lactic Acid, Venous 6.0 (HH) 0.5 - 1.9 mmol/L    Comment: CRITICAL RESULT CALLED TO, READ BACK BY AND VERIFIED  WITH: P.MCKINNEY,RN 03/26/17 0755 BY BSLADE   Lipase, blood     Status: None   Collection Time: 03/26/17  6:30 AM  Result Value Ref Range   Lipase 30 11 - 51 U/L  Magnesium     Status: None   Collection Time: 03/26/17  6:30 AM  Result Value Ref Range   Magnesium 1.8 1.7 - 2.4 mg/dL  Osmolality     Status: None   Collection Time: 03/26/17  6:30 AM  Result Value Ref Range   Osmolality 280 275 - 295 mOsm/kg  Phosphorus     Status: Abnormal   Collection Time: 03/26/17  6:30 AM  Result Value Ref Range   Phosphorus 7.4 (H) 2.5 - 4.6 mg/dL  Troponin I     Status: None   Collection Time: 03/26/17  6:30 AM  Result Value Ref Range   Troponin I <0.03 <0.03 ng/mL  Glucose, capillary     Status: None   Collection Time: 03/26/17  8:32 AM  Result Value Ref Range   Glucose-Capillary 65 65 - 99 mg/dL  I-STAT 3, arterial blood gas (G3+)     Status: Abnormal   Collection Time: 03/26/17 12:37 PM  Result Value Ref Range   pH, Arterial 7.217 (L) 7.350 - 7.450   pCO2 arterial 49.6 (H) 32.0 - 48.0 mmHg   pO2, Arterial 503.0 (H) 83.0 - 108.0 mmHg   Bicarbonate 20.1 20.0 - 28.0 mmol/L   TCO2 22 22 - 32 mmol/L   O2 Saturation 100.0 %   Acid-base deficit 8.0 (H) 0.0 - 2.0 mmol/L   Patient temperature HIDE    Collection site RADIAL, ALLEN'S TEST ACCEPTABLE    Drawn by Operator    Sample type ARTERIAL   Glucose, capillary     Status: Abnormal   Collection Time: 03/26/17 12:42 PM  Result Value Ref Range   Glucose-Capillary 102 (H) 65 - 99 mg/dL   Comment 1 Capillary Specimen   Lactic acid, plasma     Status: Abnormal   Collection Time: 03/26/17 12:51 PM  Result Value Ref Range   Lactic Acid, Venous 6.4 (HH) 0.5 - 1.9 mmol/L    Comment: CRITICAL RESULT CALLED TO, READ BACK BY AND VERIFIED WITH: Denyse Amass, RN 1346 03/26/17 CALLED BY SCLARK/MMEJIA   Protime-INR     Status: Abnormal   Collection Time: 03/26/17 12:52 PM  Result Value Ref Range   Prothrombin Time 25.3 (H) 11.4 - 15.2 seconds   INR  5.42   Basic metabolic panel     Status: Abnormal   Collection Time: 03/26/17 12:52 PM  Result Value Ref Range   Sodium 125 (L) 135 - 145 mmol/L   Potassium 5.3 (H) 3.5 - 5.1 mmol/L   Chloride 91 (L) 101 - 111 mmol/L   CO2 17 (L) 22 - 32 mmol/L   Glucose, Bld 114 (H) 65 - 99 mg/dL  BUN 41 (H) 6 - 20 mg/dL   Creatinine, Ser 1.74 (H) 0.44 - 1.00 mg/dL   Calcium 6.4 (LL) 8.9 - 10.3 mg/dL    Comment: CRITICAL RESULT CALLED TO, READ BACK BY AND VERIFIED WITHAVANNAH, DECKER, [id number] 03/26/17 SCLARK/MMEJIA    GFR calc non Af Amer 33 (L) >60 mL/min   GFR calc Af Amer 38 (L) >60 mL/min    Comment: (NOTE) The eGFR has been calculated using the CKD EPI equation. This calculation has not been validated in all clinical situations. eGFR's persistently <60 mL/min signify possible Chronic Kidney Disease.    Anion gap 17 (H) 5 - 15  Magnesium     Status: None   Collection Time: 03/26/17 12:52 PM  Result Value Ref Range   Magnesium 1.7 1.7 - 2.4 mg/dL  Phosphorus     Status: Abnormal   Collection Time: 03/26/17 12:52 PM  Result Value Ref Range   Phosphorus 7.6 (H) 2.5 - 4.6 mg/dL  Glucose, capillary     Status: Abnormal   Collection Time: 03/26/17  3:57 PM  Result Value Ref Range   Glucose-Capillary 107 (H) 65 - 99 mg/dL   Comment 1 Capillary Specimen   I-STAT 3, arterial blood gas (G3+)     Status: Abnormal   Collection Time: 03/26/17  6:19 PM  Result Value Ref Range   pH, Arterial 7.286 (L) 7.350 - 7.450   pCO2 arterial 38.5 32.0 - 48.0 mmHg   pO2, Arterial 88.0 83.0 - 108.0 mmHg   Bicarbonate 18.4 (L) 20.0 - 28.0 mmol/L   TCO2 20 (L) 22 - 32 mmol/L   O2 Saturation 96.0 %   Acid-base deficit 8.0 (H) 0.0 - 2.0 mmol/L   Patient temperature 98.0 F    Collection site ARTERIAL LINE    Drawn by Operator    Sample type ARTERIAL   Lactic acid, plasma     Status: Abnormal   Collection Time: 03/26/17  6:47 PM  Result Value Ref Range   Lactic Acid, Venous 5.4 (HH) 0.5 - 1.9 mmol/L    Comment:  CRITICAL RESULT CALLED TO, READ BACK BY AND VERIFIED WITH: N Royal Piedra 2051 03/26/2017 WBOND   Magnesium     Status: None   Collection Time: 03/26/17  7:13 PM  Result Value Ref Range   Magnesium 1.7 1.7 - 2.4 mg/dL  Phosphorus     Status: Abnormal   Collection Time: 03/26/17  7:13 PM  Result Value Ref Range   Phosphorus 7.0 (H) 2.5 - 4.6 mg/dL  Glucose, capillary     Status: None   Collection Time: 03/26/17  8:45 PM  Result Value Ref Range   Glucose-Capillary 94 65 - 99 mg/dL   Comment 1 Notify RN   Basic metabolic panel     Status: Abnormal   Collection Time: 03/26/17  9:15 PM  Result Value Ref Range   Sodium 125 (L) 135 - 145 mmol/L   Potassium 6.0 (H) 3.5 - 5.1 mmol/L   Chloride 94 (L) 101 - 111 mmol/L   CO2 17 (L) 22 - 32 mmol/L   Glucose, Bld 88 65 - 99 mg/dL   BUN 44 (H) 6 - 20 mg/dL   Creatinine, Ser 1.80 (H) 0.44 - 1.00 mg/dL   Calcium 6.2 (LL) 8.9 - 10.3 mg/dL    Comment: CRITICAL RESULT CALLED TO, READ BACK BY AND VERIFIED WITH: ALEXANDER,N RN 03/26/2017 2311 JORDANS    GFR calc non Af Amer 31 (L) >60 mL/min   GFR  calc Af Amer 36 (L) >60 mL/min    Comment: (NOTE) The eGFR has been calculated using the CKD EPI equation. This calculation has not been validated in all clinical situations. eGFR's persistently <60 mL/min signify possible Chronic Kidney Disease.    Anion gap 14 5 - 15  Lactic acid, plasma     Status: Abnormal   Collection Time: 03/26/17  9:27 PM  Result Value Ref Range   Lactic Acid, Venous 4.8 (HH) 0.5 - 1.9 mmol/L    Comment: CRITICAL RESULT CALLED TO, READ BACK BY AND VERIFIED WITH: ALEXANDER,N RN 03/26/2017 2227 JORDANS   I-STAT 7, (LYTES, BLD GAS, ICA, H+H)     Status: Abnormal   Collection Time: 03/26/17 10:42 PM  Result Value Ref Range   pH, Arterial 7.316 (L) 7.350 - 7.450   pCO2 arterial 35.0 32.0 - 48.0 mmHg   pO2, Arterial 87.0 83.0 - 108.0 mmHg   Bicarbonate 17.9 (L) 20.0 - 28.0 mmol/L   TCO2 19 (L) 22 - 32 mmol/L   O2  Saturation 96.0 %   Acid-base deficit 8.0 (H) 0.0 - 2.0 mmol/L   Sodium 126 (L) 135 - 145 mmol/L   Potassium 5.7 (H) 3.5 - 5.1 mmol/L   Calcium, Ion 0.83 (LL) 1.15 - 1.40 mmol/L   HCT 25.0 (L) 36.0 - 46.0 %   Hemoglobin 8.5 (L) 12.0 - 15.0 g/dL   Patient temperature 98.6 F    Sample type ARTERIAL    Comment NOTIFIED PHYSICIAN   Magnesium     Status: None   Collection Time: 2017/04/07  3:27 AM  Result Value Ref Range   Magnesium 1.9 1.7 - 2.4 mg/dL  Phosphorus     Status: Abnormal   Collection Time: 07-Apr-2017  3:27 AM  Result Value Ref Range   Phosphorus 8.8 (H) 2.5 - 4.6 mg/dL  CBC with Differential/Platelet     Status: Abnormal   Collection Time: April 07, 2017  3:27 AM  Result Value Ref Range   WBC 0.3 (LL) 4.0 - 10.5 K/uL    Comment: REPEATED TO VERIFY SPECIMEN CHECKED FOR CLOTS WHITE COUNT CONFIRMED ON SMEAR CRITICAL VALUE NOTED.  VALUE IS CONSISTENT WITH PREVIOUSLY REPORTED AND CALLED VALUE.    RBC 3.50 (L) 3.87 - 5.11 MIL/uL   Hemoglobin 8.9 (L) 12.0 - 15.0 g/dL   HCT 27.8 (L) 36.0 - 46.0 %   MCV 79.4 78.0 - 100.0 fL   MCH 25.4 (L) 26.0 - 34.0 pg   MCHC 32.0 30.0 - 36.0 g/dL   RDW 17.3 (H) 11.5 - 15.5 %   Platelets 15 (LL) 150 - 400 K/uL    Comment: REPEATED TO VERIFY SPECIMEN CHECKED FOR CLOTS PLATELET COUNT CONFIRMED BY SMEAR CRITICAL RESULT CALLED TO, READ BACK BY AND VERIFIED WITHRaynaldo Opitz RN [phone] [id number] BY SHORTT    Neutrophils Relative % 12 %   Lymphocytes Relative 73 %   Monocytes Relative 15 %   Eosinophils Relative 0 %   Basophils Relative 0 %   Neutro Abs 0.0 (L) 1.7 - 7.7 K/uL   Lymphs Abs 0.3 (L) 0.7 - 4.0 K/uL   Monocytes Absolute 0.0 (L) 0.1 - 1.0 K/uL   Eosinophils Absolute 0.0 0.0 - 0.7 K/uL   Basophils Absolute 0.0 0.0 - 0.1 K/uL   RBC Morphology BURR CELLS    Smear Review      PATH REVIEW HAS BEEN ORDERED ON THIS ACCESSION NUMBER  Comprehensive metabolic panel     Status: Abnormal   Collection Time:  2017-04-19  3:27 AM  Result Value Ref Range    Sodium 123 (L) 135 - 145 mmol/L   Potassium 6.7 (HH) 3.5 - 5.1 mmol/L    Comment: CRITICAL RESULT CALLED TO, READ BACK BY AND VERIFIED WITH: ALEXANDER,N RN 04/19/2017 0437 JORDANS    Chloride 93 (L) 101 - 111 mmol/L   CO2 11 (L) 22 - 32 mmol/L   Glucose, Bld 82 65 - 99 mg/dL   BUN 46 (H) 6 - 20 mg/dL   Creatinine, Ser 2.08 (H) 0.44 - 1.00 mg/dL   Calcium 6.1 (LL) 8.9 - 10.3 mg/dL    Comment: CRITICAL RESULT CALLED TO, READ BACK BY AND VERIFIED WITH: ALEXANDER,N RN 04/19/17 0437 JORDANS    Total Protein 4.4 (L) 6.5 - 8.1 g/dL   Albumin 1.4 (L) 3.5 - 5.0 g/dL   AST 233 (H) 15 - 41 U/L   ALT 73 (H) 14 - 54 U/L   Alkaline Phosphatase 135 (H) 38 - 126 U/L   Total Bilirubin 3.9 (H) 0.3 - 1.2 mg/dL   GFR calc non Af Amer 26 (L) >60 mL/min   GFR calc Af Amer 30 (L) >60 mL/min    Comment: (NOTE) The eGFR has been calculated using the CKD EPI equation. This calculation has not been validated in all clinical situations. eGFR's persistently <60 mL/min signify possible Chronic Kidney Disease.    Anion gap 19 (H) 5 - 15  Protime-INR     Status: Abnormal   Collection Time: 04-19-2017  3:27 AM  Result Value Ref Range   Prothrombin Time 28.6 (H) 11.4 - 15.2 seconds   INR 2.71   ABO/Rh     Status: None   Collection Time: 2017/04/19  3:27 AM  Result Value Ref Range   ABO/RH(D) O POS   Prepare Pheresed Platelets     Status: None (Preliminary result)   Collection Time: 19-Apr-2017  5:00 AM  Result Value Ref Range   Unit Number [id number]    Blood Component Type PLTP LR1 PAS    Unit division 00    Status of Unit ISSUED    Transfusion Status OK TO TRANSFUSE   Prepare fresh frozen plasma     Status: None (Preliminary result)   Collection Time: 2017/04/19  5:00 AM  Result Value Ref Range   Unit Number [id number]    Blood Component Type THAWED PLASMA    Unit division 00    Status of Unit ISSUED    Transfusion Status OK TO TRANSFUSE    Unit Number [id number]    Blood Component Type  THAWED PLASMA    Unit division 00    Status of Unit ISSUED    Transfusion Status OK TO TRANSFUSE    Unit Number [id number]    Blood Component Type THWPLS APHR1    Unit division 00    Status of Unit ISSUED    Transfusion Status OK TO TRANSFUSE    Unit Number [id number]    Blood Component Type THAWED PLASMA    Unit division 00    Status of Unit ISSUED    Transfusion Status OK TO TRANSFUSE    Ct Abdomen Pelvis Wo Contrast  Result Date: 03/23/2017 CLINICAL DATA:  Acute onset of neutropenia and generalized abdominal distention. Current history of metastatic cancer to the liver. Initial encounter. EXAM: CT ABDOMEN AND PELVIS WITHOUT CONTRAST TECHNIQUE: Multidetector CT imaging of the abdomen and pelvis was performed following the standard protocol without IV contrast. COMPARISON:  CT of the abdomen and  pelvis, and MRI of the abdomen, performed 03/03/2017 FINDINGS: Lower chest: Patchy bibasilar airspace opacities raise concern for multifocal pneumonia. The heart is borderline normal in size. The enteric tube is noted ending at the body of the stomach. Hepatobiliary: Innumerable metastases are noted throughout the liver, with trace surrounding ascites. Stones are noted within the gallbladder. The gallbladder is otherwise unremarkable. Pancreas: The pancreas is within normal limits. Spleen: The spleen is unremarkable in appearance. Adrenals/Urinary Tract: Nonspecific stranding is noted about the adrenal glands. The adrenal glands are otherwise unremarkable. Mild nonspecific perinephric stranding is noted bilaterally. The kidneys are otherwise unremarkable. There is no evidence of hydronephrosis. No renal or ureteral stones are identified. Stomach/Bowel: The stomach is unremarkable in appearance. The small bowel is within normal limits. The appendix is normal in caliber, without evidence of appendicitis. There is fatty infiltration within the wall of the cecum and ascending colon, reflecting chronic  sequelae of inflammation. Mild wall thickening is noted along the distal transverse and descending colon, raising question for a mild infectious or inflammatory process. Vascular/Lymphatic: Scattered calcification is seen along the abdominal aorta and its branches. The abdominal aorta is otherwise grossly unremarkable. The inferior vena cava is grossly unremarkable. No retroperitoneal lymphadenopathy is seen. No pelvic sidewall lymphadenopathy is identified. Reproductive: The bladder is mildly distended and grossly unremarkable. The uterus is unremarkable in appearance. The ovaries are relatively symmetric. No suspicious adnexal masses are seen. Other: Soft tissue edema is noted along the abdominal and pelvic wall, raising question for mild anasarca. A 3.5 cm prominent subcutaneous soft tissue nodule is noted overlying the right gluteus musculature. Musculoskeletal: No acute osseous abnormalities are identified. Facet disease is noted at the lower lumbar spine. The visualized musculature is unremarkable in appearance. IMPRESSION: 1. Patchy bibasilar airspace opacities raise concern for multifocal pneumonia. 2. Mild wall thickening along the distal transverse and descending colon raises question for a mild infectious or inflammatory process. 3. Innumerable metastases again noted throughout the liver, with trace surrounding ascites. 4. Soft tissue edema along the abdominal and pelvic wall raises question for mild anasarca. 5. 3.5 cm prominent subcutaneous soft tissue nodule again noted overlying the right gluteus musculature. 6. Cholelithiasis. 7. Scattered aortic atherosclerosis. Electronically Signed   By: Garald Balding M.D.   On: 03/21/2017 23:57   Dg Abd 1 View  Result Date: 03/26/2017 CLINICAL DATA:  Encounter for orogastric tube placement EXAM: ABDOMEN - 1 VIEW COMPARISON:  Portable exam 1230 hours compared to CT of 04/04/2017 FINDINGS: Tip of orogastric tube projects over proximal stomach. Visualized  bowel gas pattern unremarkable. No acute osseous findings or pathologic calcifications in upper abdomen. Pelvis excluded. IMPRESSION: Tip of orogastric tube projects over proximal stomach. Electronically Signed   By: Lavonia Dana M.D.   On: 03/26/2017 12:43   Ct Head Wo Contrast  Result Date: 03/26/2017 CLINICAL DATA:  Altered mental status EXAM: CT HEAD WITHOUT CONTRAST TECHNIQUE: Contiguous axial images were obtained from the base of the skull through the vertex without intravenous contrast. COMPARISON:  None. FINDINGS: Brain: Mild cerebral volume loss, advanced for patient's age. No acute intracranial abnormality. Specifically, no hemorrhage, hydrocephalus, mass lesion, acute infarction, or significant intracranial injury. Vascular: No hyperdense vessel or unexpected calcification. Skull: No acute calvarial abnormality. Sinuses/Orbits: Visualized paranasal sinuses and mastoids clear. Orbital soft tissues unremarkable. Other: None IMPRESSION: Mild age advanced cerebral atrophy.  No acute findings. Electronically Signed   By: Rolm Baptise M.D.   On: 03/26/2017 10:46   Ct Chest Wo Contrast  Result Date: 03/26/2017 CLINICAL DATA:  Metastatic right lung cancer. Recent intubation for shortness of breath. On broad-spectrum antibiotics. EXAM: CT CHEST WITHOUT CONTRAST TECHNIQUE: Multidetector CT imaging of the chest was performed following the standard protocol without IV contrast. COMPARISON:  Radiographs 03/20/2017. Chest CT 03/22/2017 and 03/03/2017. FINDINGS: Cardiovascular: There is mild aortic and coronary artery atherosclerosis. The main pulmonary artery is moderately dilated to 4.5 cm, consistent with pulmonary arterial hypertension. No acute vascular findings are evident on noncontrast imaging. The heart size is normal. There is no pericardial effusion. Mediastinum/Nodes: Stable mildly prominent right axillary lymph nodes, measuring up to 9 mm short axis. No enlarged mediastinal or hilar lymph nodes are  seen. Hilar assessment is limited by the lack of intravenous contrast, although the hilar contours appear unchanged.Endotracheal and nasogastric tubes are satisfactorily positioned. The thyroid gland, trachea and esophagus demonstrate no significant findings. Lungs/Pleura: There is no pleural effusion or pneumothorax. Known spiculated right lower lobe lesion is grossly stable, measuring approximately 3.3 x 1.4 cm on image 85. There are new patchy airspace opacities in both lower lobes and to a lesser degree the right middle lobe and lingula, worrisome for pneumonia. Upper abdomen: The liver is enlarged with heterogeneous low density. Known metastases are not well visualized. Musculoskeletal/Chest wall: There is no chest wall mass or suspicious osseous finding. Old rib fractures on the right. IMPRESSION: 1. New patchy airspace opacities in both lower lobes suspicious for pneumonia, possibly on the basis of aspiration. 2. Known right lower lobe pulmonary nodule grossly unchanged, presumably neoplastic based on prior studies. 3. Satisfactory position of support system. No pneumothorax or significant pleural effusion. 4. Known metastatic disease to the liver, not well visualized. 5. Known central pulmonary arterial enlargement. Electronically Signed   By: Richardean Sale M.D.   On: 03/26/2017 10:57   Dg Chest Port 1 View  Result Date: April 08, 2017 CLINICAL DATA:  Shortness of breath. EXAM: PORTABLE CHEST 1 VIEW COMPARISON:  03/26/2017.  CT 03/26/2017. FINDINGS: Endotracheal tube, NG tube, left IJ line stable position. Stable cardiomegaly. No pulmonary venous congestion. Persistent bibasilar atelectasis and infiltrates. Reference is made to prior CT report 03/26/2017. IMPRESSION: 1.  Lines and tubes in stable position. 2. Persistent bibasilar atelectasis/ infiltrates. No significant change. 3. Stable cardiomegaly. Electronically Signed   By: Marcello Moores  Register   On: 2017/04/08 07:03   Dg Chest Port 1 View  Result  Date: 03/26/2017 CLINICAL DATA:  Encounter for central line placement EXAM: PORTABLE CHEST 1 VIEW COMPARISON:  Portable exam 1230 hours compared to 03/30/2017 FINDINGS: Tip of endotracheal tube projects 2.8 cm above carina. Nasogastric tube extends into stomach. LEFT jugular central venous catheter with tip projecting over SVC. Numerous EKG leads project over chest. Minimal enlargement of cardiac silhouette. Mediastinal contours and pulmonary vascularity normal. Subsegmental atelectasis at RIGHT base with additional atelectasis versus consolidation in LEFT lower lobe increased since previous exam. Upper lungs clear. No gross pleural effusion or pneumothorax. IMPRESSION: Increased LEFT lower lobe atelectasis versus consolidation. RIGHT basilar atelectasis. No pneumothorax following LEFT jugular line placement. Electronically Signed   By: Lavonia Dana M.D.   On: 03/26/2017 12:45   Portable Chest Xray  Result Date: 03/30/2017 CLINICAL DATA:  Endotracheal tube and nasogastric tube placement. Initial encounter. EXAM: PORTABLE CHEST 1 VIEW COMPARISON:  Chest radiograph performed earlier today at 6:03 p.m. FINDINGS: The patient's endotracheal tube is seen ending 1-2 cm above the carina. This could be retracted 1-2 cm. An enteric tube is noted extending below the diaphragm. Retrocardiac airspace  opacity raises concern for pneumonia. No pleural effusion or pneumothorax is seen. The cardiomediastinal silhouette is borderline normal in size. No acute osseous abnormalities are identified. IMPRESSION: 1. Endotracheal tube seen ending 1-2 cm above the carina. This could be retracted 1-2 cm. 2. Enteric tube noted extending below the diaphragm. 3. Retrocardiac airspace opacity raises concern for pneumonia. Electronically Signed   By: Garald Balding M.D.   On: 04/02/2017 23:42   Dg Chest Port 1 View  Result Date: 03/18/2017 CLINICAL DATA:  Short of breath EXAM: PORTABLE CHEST 1 VIEW COMPARISON:  CT chest 03/22/2014  FINDINGS: New area of left lower lobe airspace disease, consistent with pneumonia. No significant effusion. Negative for heart failure. Right lower lobe density seen on the prior study not well visualized today. IMPRESSION: Left lower lobe infiltrate, probable pneumonia Electronically Signed   By: Franchot Gallo M.D.   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 ischemic or infectious) 6 Hyperkalemia 7 Met acidosis 8 Pneumonia/ARDS 9 Metastatic cancer  Plan: 1 PR kayexalate  2 dialysis if stable and needed  Estanislado Emms 04-16-2017, 7:10 AM

## 2017-04-05 NOTE — Procedures (Signed)
Central Venous Catheter Insertion Procedure Note Marisa Kim [id number] 01-Jul-1965  Procedure: Insertion of Central Venous Catheter Indications: hd  Procedure Details Consent: Risks of procedure as well as the alternatives and risks of each were explained to the (patient/caregiver).  Consent for procedure obtained. Time Out: Verified patient identification, verified procedure, site/side was marked, verified correct patient position, special equipment/implants available, medications/allergies/relevent history reviewed, required imaging and test results available.  Performed  Maximum sterile technique was used including antiseptics, cap, gloves, gown, hand hygiene, mask and sheet. Skin prep: Chlorhexidine; local anesthetic administered A antimicrobial bonded/coated triple lumen catheter was placed in the right internal jugular vein using the Seldinger technique.  Evaluation Blood flow good Complications: No apparent complications Patient did tolerate procedure well. Chest X-ray ordered to verify placement.  CXR: pending.  Marisa Kim 04/01/2017, 10:56 AM  [place] [facility], plat prior  Marisa Kim. Titus Mould, MD, [place] Pgr: [facility]

## 2017-04-05 NOTE — Progress Notes (Signed)
Patient is asystole no heart sounds nor breath sounds ascultated by myself or Heide Guile RN. Family at bedside emotional support given

## 2017-04-05 NOTE — Progress Notes (Signed)
Pt's BP has become increasingly low, despite increase in pressor use. MD Titus Mould made aware- family still at bedside, chaplain paged.   Order for 2 amp bicarb and 1 amp calcium gluconate received. May increase levophed for systolic >63  Lorre Munroe

## 2017-04-05 NOTE — Progress Notes (Addendum)
eLink Physician-Brief Progress Note Patient Name: Marisa Kim DOB: Nov 29, 1965 MRN: [id number]   Date of Service  2017-04-23  HPI/Events of Note  K 6.7  Broad complex junctional rhythm  eICU Interventions  DC Amio Bicarb 2 amps stat Calcium x1 D50/ insulin 10 u Stat ABG >> bicarb gtt 4 U FF P + 1 U Platelet transfusion  -may need HD cath     Intervention Category Major Interventions: Electrolyte abnormality - evaluation and management;Arrhythmia - evaluation and management  Aryaan Persichetti V. Apr 23, 2017, 4:52 AM

## 2017-04-05 NOTE — Progress Notes (Addendum)
PULMONARY / CRITICAL CARE MEDICINE   Name: Marisa Kim MRN: [id number] DOB: Jun 24, 1965    ADMISSION DATE:  03/10/2017 CONSULTATION DATE:  04/03/2017  REFERRING MD:  EDP  CHIEF COMPLAINT:  SOB and weakness  HISTORY OF PRESENT ILLNESS:   51 year old female with pmx of cervical cancer (1980's), pulmonary stenosis as a child s/p open heart surgery, recent diagnosis of Right lung CA with mets to liver started on chemo on Monday presents for progressive shortness of breath and weakness. Patient was seen in the ED for shortness of breath on 10/18 and given breathing treatments and sent home after ruling out PE/ACS. As per patient and her family she didn't improve after going home and kept getting worse. Denies chest pain but had dyspnea on exertion and new onset orthopnea. After the chemo patient began to have progressive weakness and lack of appetitie. 2 days ago patient began to have B/L leg swelling and diarrhea. Denies blood in stool. Denies dysuria/hematuria. + non productive cough. Denies fevers or abdominal pain. Patient did notice her abdomen getting bigger.   Was seen at [facility]. Patient was uncomfortable and in respiratory distress. Did not tolerate BIPAP. Saturating well on 10L [place] but very clearly dyspneic. Patient was given 2.5 L of IVF in the ED and started on broad spectrum ABX. Patient states that she is very tired. Current condition was explained to patient husband and daughter (nurse at [facility]) and agreed to intubate the patient.   Transferred to [facility] for closer monitoring.   SUBJECTIVE:   AF with RVR overnight > started on amio. Had worsening shock early AM with wide complex junctional rhythm.  Amio stopped.  HyperK (6.7) - temporized, repeat BMP pending.  Currently on levophed at 75mcg/min, vasopressin 0.3units/min, angiotensin II 34ml/hr. MAP currently in 70's. Receiving 4u platelets and 10mg  Vit K for coagulopathy (INR 2.7).   VITAL SIGNS: BP (!)  151/76   Pulse (!) 115   Temp 98.6 F (37 C) (Oral)   Resp 13   Ht 5' (1.524 m)   Wt 92.6 kg (204 lb 2.3 oz)   SpO2 98%   BMI 39.87 kg/m   HEMODYNAMICS: CVP:  [14 mmHg-23 mmHg] 23 mmHg  VENTILATOR SETTINGS: Vent Mode: PRVC FiO2 (%):  [40 %] 40 % Set Rate:  [30 bmp-35 bmp] 35 bmp Vt Set:  [370 mL] 370 mL PEEP:  [5 cmH20-8 cmH20] 8 cmH20 Plateau Pressure:  [12 cmH20-19 cmH20] 12 cmH20  INTAKE / OUTPUT: I/O last 3 completed shifts: In: 8821 [I.V.:4710; Blood:571; NG/GT:340; IV Piggyback:3200] Out: 5631 [Urine:273; Emesis/NG output:1305]  PHYSICAL EXAMINATION: General:  Adult female, critically ill. Neuro:  Sedated, does not follow commands. HEENT:  NCAT, right pupil > left, ETT in place. Cardiovascular:  RRR, no M/R/G. Lungs:  Coarse bilaterally. Abdomen:  Obese, soft. Musculoskeletal:  SCD in place, bilateral LE edema present. Skin:  Intact, dry.  LABS:  BMET  Recent Labs Lab 03/26/17 1252 03/26/17 2115 03/26/17 2242 04/17/2017 0327  NA 125* 125* 126* 123*  K 5.3* 6.0* 5.7* 6.7*  CL 91* 94*  --  93*  CO2 17* 17*  --  11*  BUN 41* 44*  --  46*  CREATININE 1.74* 1.80*  --  2.08*  GLUCOSE 114* 88  --  82    Electrolytes  Recent Labs Lab 03/26/17 1252 03/26/17 1913 03/26/17 2115 17-Apr-2017 0327  CALCIUM 6.4*  --  6.2* 6.1*  MG 1.7 1.7  --  1.9  PHOS  7.6* 7.0*  --  8.8*    CBC  Recent Labs Lab 03/05/2017 1853 03/26/17 0630 03/26/17 2242 2017-03-28 0327  WBC 0.2* 0.3*  --  0.3*  HGB 10.3* 9.3* 8.5* 8.9*  HCT 30.8* 28.5* 25.0* 27.8*  PLT 73* 45*  --  15*    Coag's  Recent Labs Lab 03/22/17 1930 03/26/17 1252 2017/03/28 0327  INR 1.31 2.32 2.71    Sepsis Markers  Recent Labs Lab 03/26/17 1251 03/26/17 1847 03/26/17 2127  LATICACIDVEN 6.4* 5.4* 4.8*    ABG  Recent Labs Lab 03/26/17 1237 03/26/17 1819 03/26/17 2242  PHART 7.217* 7.286* 7.316*  PCO2ART 49.6* 38.5 35.0  PO2ART 503.0* 88.0 87.0    Liver Enzymes  Recent  Labs Lab 03/22/2017 1853 03/26/17 0630 Mar 28, 2017 0327  AST 175* 157* 233*  ALT 73* 65* 73*  ALKPHOS 210* 150* 135*  BILITOT 3.6* 3.2* 3.9*  ALBUMIN 2.2* 1.7* 1.4*    Cardiac Enzymes  Recent Labs Lab 03/26/17 0630  TROPONINI <0.03    Glucose  Recent Labs Lab 03/26/17 0050 03/26/17 0832 03/26/17 1242 03/26/17 1557 03/26/17 2045 March 28, 2017 0720  GLUCAP 74 65 102* 107* 94 123*    Imaging Dg Abd 1 View  Result Date: 03/26/2017 CLINICAL DATA:  Encounter for orogastric tube placement EXAM: ABDOMEN - 1 VIEW COMPARISON:  Portable exam 1230 hours compared to CT of 03/15/2017 FINDINGS: Tip of orogastric tube projects over proximal stomach. Visualized bowel gas pattern unremarkable. No acute osseous findings or pathologic calcifications in upper abdomen. Pelvis excluded. IMPRESSION: Tip of orogastric tube projects over proximal stomach. Electronically Signed   By: Lavonia Dana M.D.   On: 03/26/2017 12:43   Ct Head Wo Contrast  Result Date: 03/26/2017 CLINICAL DATA:  Altered mental status EXAM: CT HEAD WITHOUT CONTRAST TECHNIQUE: Contiguous axial images were obtained from the base of the skull through the vertex without intravenous contrast. COMPARISON:  None. FINDINGS: Brain: Mild cerebral volume loss, advanced for patient's age. No acute intracranial abnormality. Specifically, no hemorrhage, hydrocephalus, mass lesion, acute infarction, or significant intracranial injury. Vascular: No hyperdense vessel or unexpected calcification. Skull: No acute calvarial abnormality. Sinuses/Orbits: Visualized paranasal sinuses and mastoids clear. Orbital soft tissues unremarkable. Other: None IMPRESSION: Mild age advanced cerebral atrophy.  No acute findings. Electronically Signed   By: Rolm Baptise M.D.   On: 03/26/2017 10:46   Ct Chest Wo Contrast  Result Date: 03/26/2017 CLINICAL DATA:  Metastatic right lung cancer. Recent intubation for shortness of breath. On broad-spectrum antibiotics. EXAM: CT  CHEST WITHOUT CONTRAST TECHNIQUE: Multidetector CT imaging of the chest was performed following the standard protocol without IV contrast. COMPARISON:  Radiographs 04/01/2017. Chest CT 03/22/2017 and 03/03/2017. FINDINGS: Cardiovascular: There is mild aortic and coronary artery atherosclerosis. The main pulmonary artery is moderately dilated to 4.5 cm, consistent with pulmonary arterial hypertension. No acute vascular findings are evident on noncontrast imaging. The heart size is normal. There is no pericardial effusion. Mediastinum/Nodes: Stable mildly prominent right axillary lymph nodes, measuring up to 9 mm short axis. No enlarged mediastinal or hilar lymph nodes are seen. Hilar assessment is limited by the lack of intravenous contrast, although the hilar contours appear unchanged.Endotracheal and nasogastric tubes are satisfactorily positioned. The thyroid gland, trachea and esophagus demonstrate no significant findings. Lungs/Pleura: There is no pleural effusion or pneumothorax. Known spiculated right lower lobe lesion is grossly stable, measuring approximately 3.3 x 1.4 cm on image 85. There are new patchy airspace opacities in both lower lobes and to a lesser  degree the right middle lobe and lingula, worrisome for pneumonia. Upper abdomen: The liver is enlarged with heterogeneous low density. Known metastases are not well visualized. Musculoskeletal/Chest wall: There is no chest wall mass or suspicious osseous finding. Old rib fractures on the right. IMPRESSION: 1. New patchy airspace opacities in both lower lobes suspicious for pneumonia, possibly on the basis of aspiration. 2. Known right lower lobe pulmonary nodule grossly unchanged, presumably neoplastic based on prior studies. 3. Satisfactory position of support system. No pneumothorax or significant pleural effusion. 4. Known metastatic disease to the liver, not well visualized. 5. Known central pulmonary arterial enlargement. Electronically Signed    By: Richardean Sale M.D.   On: 03/26/2017 10:57   Dg Chest Port 1 View  Result Date: 2017-04-09 CLINICAL DATA:  Shortness of breath. EXAM: PORTABLE CHEST 1 VIEW COMPARISON:  03/26/2017.  CT 03/26/2017. FINDINGS: Endotracheal tube, NG tube, left IJ line stable position. Stable cardiomegaly. No pulmonary venous congestion. Persistent bibasilar atelectasis and infiltrates. Reference is made to prior CT report 03/26/2017. IMPRESSION: 1.  Lines and tubes in stable position. 2. Persistent bibasilar atelectasis/ infiltrates. No significant change. 3. Stable cardiomegaly. Electronically Signed   By: Marcello Moores  Register   On: 2017/04/09 07:03   Dg Chest Port 1 View  Result Date: 03/26/2017 CLINICAL DATA:  Encounter for central line placement EXAM: PORTABLE CHEST 1 VIEW COMPARISON:  Portable exam 1230 hours compared to 03/14/2017 FINDINGS: Tip of endotracheal tube projects 2.8 cm above carina. Nasogastric tube extends into stomach. LEFT jugular central venous catheter with tip projecting over SVC. Numerous EKG leads project over chest. Minimal enlargement of cardiac silhouette. Mediastinal contours and pulmonary vascularity normal. Subsegmental atelectasis at RIGHT base with additional atelectasis versus consolidation in LEFT lower lobe increased since previous exam. Upper lungs clear. No gross pleural effusion or pneumothorax. IMPRESSION: Increased LEFT lower lobe atelectasis versus consolidation. RIGHT basilar atelectasis. No pneumothorax following LEFT jugular line placement. Electronically Signed   By: Lavonia Dana M.D.   On: 03/26/2017 12:45     STUDIES:  ECHO 10/22 > EF 60-65%, RV overload. EEG 10/22 > diffuse cerebral dysfunction, non-specific in etiology. LE duplex 10/23 >  CXR 10/23 > bibasilar atelectasis.  CULTURES: Blood 10/21 > GNR's > Enterobacter species, E.Coli, Klebsiella oytoca, Klebsiella pneumoniae >   ANTIBIOTICS: Vancomycin 10/21 >  Cefepime 10/21 > 10/22 Merrem 10/22 >    SIGNIFICANT EVENTS: Intubated ED for respiratory distress 10/21  LINES/TUBES: PIV Foley 10/21 ETT 10/21 >  L IJ CVL 10/22 >  R radial a line 10/22 >    DISCUSSION: 51 year old female with recent diagnosis of right lung CA with lever mets presents for worsening shortness of breath and weakness along with new N/V/diarrhea.  Neutropenic s/p chemo on monday  ASSESSMENT / PLAN:  PULMONARY A:  Ventilator dependent respiratory failure secondary to acute hypoxic respiratory failure Respiratory acidosis New Lung Ca with liver mets H/O congenital Pulmonary stenosis  P:   Continue full vent support No weaning given hemodynamic instability Bronchial hygiene Follow CXR Heme/Onc consulted  CARDIOVASCULAR A: Septic Shock - due to polymicrobial bacteremia Wide complex junctional rhythm - most likely due to hyperkalemia; s/p temporizing measures H/O HTN P:  Continue levophed, vasopressin, angiotensin II for goal MAP > 65 Continue stress dose steroids Follow CVP Repeat lactate DNR status confirmed with husband - chart updated  RENAL A:   Hyperkalemia Anion Gap metabolic acidosis secondary to Lactic acidosis  Acute renal failure secondary to pre-renal (most likely )  secondary to poor PO intake vs renal (ATN) - now oliguric Hyponatremia secondary to hypervolemic hyponatremia (SIADH vs CHF) vs Poor po intake  P:   Nephrology consulted - additional kayexalate given now Repeat BMP now Will likely need CVVHD  GASTROINTESTINAL A:   Neutropenic typhlitis with inflammation of transverse and descending colon. Cholelithiasis  Liver mets with mild surrounding ascites  P:   Continue with broad spectrum abx PPI NPO   HEMATOLOGIC A:   Neutropenic with Absolute neutrophil count  0.01 secondary to chemotherapy New Lung Ca with liver mets Pancytopenia - due to recent chemo Microcytic anemia   Coagulopathy - currently receiving 4u platelets and already received 10mg  Vit K P:   Heme/Onc consulted Patient states she has been having some vaginal bleeding. Unclear if this is new or old and if worked up  Repeat coags now CBC and coags in AM  INFECTIOUS A:   Neutropenic fever Neutropenic typhlitis Multifocal PNA Polymicrobial bacteremia - Blood cultures 10/21 growing Enterobacter species, E.Coli, Klebsiella oytoca, Klebsiella pneumoniae  P:   Continue broad spectrum abx Vancomycin and merrem F/u blood cultures through completion Neutropenic precaution   ENDOCRINE A:   R/O adrenal insufficiency - Unlikely though due to short duration of steroids (started10/15) P:   Continue stress dose steroids  NEUROLOGIC A:   Sedation due to mechanical ventilation Right pupil dilated > left  P:   Sedation: Fentanyl gtt / Midazolam PRN RASS goal: -1 to -2. Daily WUA. Consider neuroimaging once stabilized   FAMILY  - Updates:  Husband updated at bedside 10/23. He tells me that pt has already had advanced directives and had opted for DNR prior to this hospitalization.  He states that oncology has told pt and family that chemo is palliative (confirmed in oncology notes). Chart updated with DNR status.  OK with temporary CVVHD if needed.  - Inter-disciplinary family meet or Palliative Care meeting due by:  10/29  CC time: 40 min.   Montey Hora, [facility] Pager: [phone]  or [phone] Apr 03, 2017, 8:07 AM   STAFF NOTE: I, Merrie Roof, MD FACP have personally reviewed patient's available data, including medical history, events of note, physical examination and test results as part of my evaluation. I have discussed with resident/NP and other care providers such as pharmacist, RN and RRT. In addition, I personally evaluated patient and elicited key findings of: rass -3, not fc, line clean, ronchi, abdo soft, BS low, no r/g, some distention, no rash, edema present, was pos 5.3 liters l;ast 24 hours, pcxr  which I reviewed shows slight int changes,m ett wnl, line wnl, she had Vt overnight on amio and hyperkalemia with worsening arf, on Ang 2 at 40, levo did reduce to 20,last abg reviewed keep rate 35, keep plat less 30, some blood in ett , plat and ffp given, no SBT with high MV, peep to 8 required, abdo is source likely translocated off enteritis, keep Imipenem and follow sens of 3 gram neg organisms, if stable would repeat CT abdo with oral contrast, levo titration down as able, holding off vaso with ischemic bowel risk, consider bicarb drip and kaylate given, I would favor early cvvhd NOW, will d/w renal, especially as products given for line needed, ensure ABg q6h, bmet frequent, kayxlate given, trickle feeds may need to hold, Na on rise with saline, per now, head CT neg, may have been from prop?, fent drip to rass -2,  versed prn, plat transfusion given for line, continued neuopogen, I prefer dvt prevention on ang 2, will start when able, dose vit K, repeat coags today , I updated husban, fibrinogen reviewed, no role cryo for now, stress steroids to remain, dc florinef with renal failurem, I wold favor continued support and HD in setting possible reversible gram neg septic shock, DNR would support if arrest, amio off with with arrythmia noted, no role neo at this stage The patient is critically ill with multiple organ systems failure and requires high complexity decision making for assessment and support, frequent evaluation and titration of therapies, application of advanced monitoring technologies and extensive interpretation of multiple databases.   Critical Care Time devoted to patient care services described in this note is 32 Minutes. This time reflects time of care of this signee: Merrie Roof, MD FACP. This critical care time does not reflect procedure time, or teaching time or supervisory time of PA/NP/Med student/Med Resident etc but could involve care discussion time. Rest per NP/medical  resident whose note is outlined above and that I agree with   Lavon Paganini. Titus Mould, MD, [place] Pgr: [facility] April 26, 2017 8:25 AM

## 2017-04-05 NOTE — Procedures (Signed)
Extubation Procedure Note  Patient Details:   Name: Margaretmary Prisk DOB: 01/15/1966 MRN: [id number]   Airway Documentation: Patient extubated by Sharyne Peach, RRT per end of life protocol.     Evaluation  O2 sats: currently acceptable Complications: No apparent complications Patient did not tolerate procedure well. Bilateral Breath Sounds: Diminished   No  Ned Grace 04/20/2017, 4:14 PM

## 2017-04-05 NOTE — Progress Notes (Addendum)
MD Titus Mould made aware that pt is having frequent pauses and bursts of afib with RVR. Order for ABG, 2 amps biarb and 1 amp calcium received and given.   VO received to hold TF due to abdominal distension.   Titus Mould made aware of lactic acid and ABG critical results  Family made aware of acute hemodynamic changes, and encouraged presence at bedside  Lorre Munroe

## 2017-04-05 NOTE — Progress Notes (Signed)
150 mls fentanyl wasted and witnessed by Heide Guile RN

## 2017-04-05 DEATH — deceased

## 2017-04-09 ENCOUNTER — Other Ambulatory Visit: Payer: Self-pay

## 2017-04-09 ENCOUNTER — Ambulatory Visit: Payer: Self-pay

## 2017-04-10 ENCOUNTER — Encounter (HOSPITAL_COMMUNITY): Payer: Self-pay

## 2017-05-05 NOTE — Discharge Summary (Signed)
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 Patient was seen in the ED for shortness of breath on 10/18 and given breathing treatments and sent home after ruling out PE/ACS. As per patient and her family she didn't improve after going home and kept getting worse. Denies chest pain but had dyspnea on exertion and new onset orthopnea. After the chemo patient began to have progressive weakness and lack of appetitie. 2 days ago patient began to have B/L leg swelling and diarrhea. Denies blood in stool. Denies dysuria/hematuria. + non productive cough. Denies fevers or abdominal pain. Patient did notice her abdomen getting bigger.   Was seen at [facility]. Patient was uncomfortable and in respiratory distress. Did not tolerate BIPAP. Saturating well on 10L [place] but very clearly dyspneic. Patient was given 2.5 L of IVF in the ED and started on broad spectrum ABX. Patient states that she is very tired. Current condition was explained to patient husband and daughter (nurse at [facility]) and agreed to intubate the patient.    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 Had worsening shock early AM with wide complex junctional rhythm.  Amio stopped.  HyperK (6.7) - temporized, repeat BMP pending.  Currently on levophed at 55mg/min, vasopressin 0.3units/min, angiotensin II 460mhr. MAP currently in 70's. Receiving 4u platelets and 1040mit K for coagulopathy (INR 2.7).  Family stated clearly she would not want to be put through this further, her progosis of cancer is poor per oncology with mets They opted for  comfort care  Final diagnosis upon death  1.Multiple gram Neg bacteremia 2. 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 neurtropenic septic shock 3. ARF 5. PNA 6, Acute renal failure 7. Refractory shock 8, met cancer 9. Comfort care  DanLavon PaganinieiTitus MouldD, FACNew Chicagor: [facility]

## 2017-05-10 ENCOUNTER — Other Ambulatory Visit: Payer: Self-pay | Admitting: Nurse Practitioner

## 2017-05-31 ENCOUNTER — Encounter (HOSPITAL_COMMUNITY): Payer: Self-pay

## 2017-12-19 IMAGING — CT CT HEAD W/O CM
4 series · 16 of 47 positions shown, 18 images · non-contrast
Comparison: None.

CLINICAL DATA: Altered mental status

EXAM:
CT HEAD WITHOUT CONTRAST
TECHNIQUE: Contiguous axial images were obtained from the base of the skull
through the vertex without intravenous contrast.

[Series 503: head wo · axial · 0.49mm/px · z∈[-129,+6]mm · 7 of 37 slices shown, 9 images]
[im 5/37  brain]
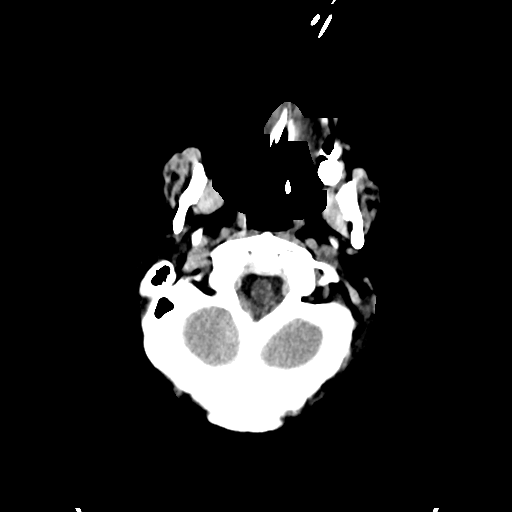
[im 5/37  bone]
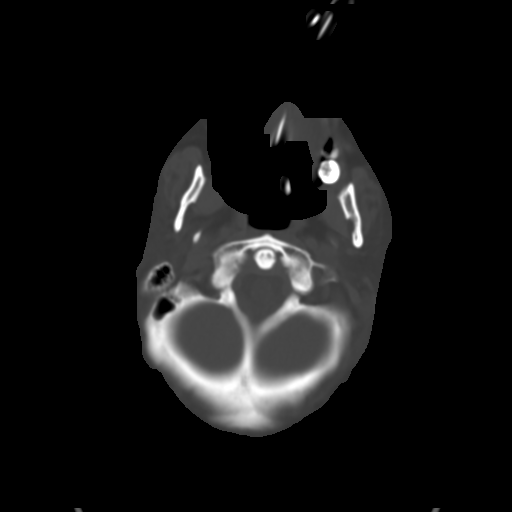
[im 10/37  brain]
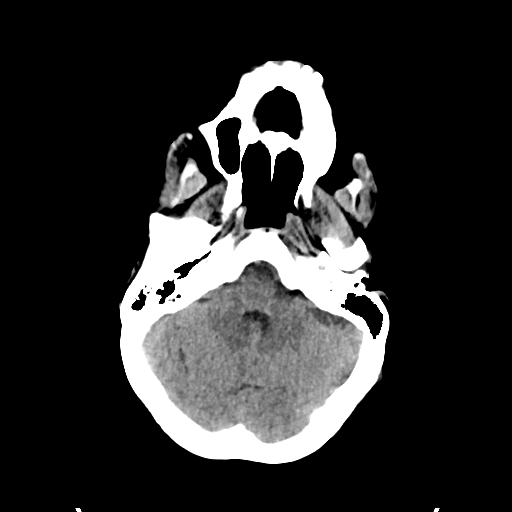
[im 14/37  brain]
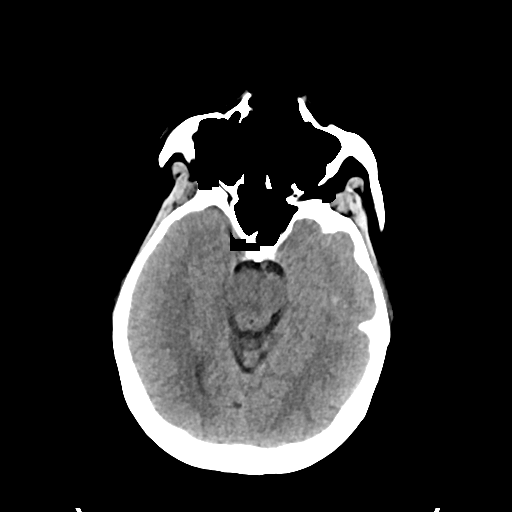
[im 19/37  brain]
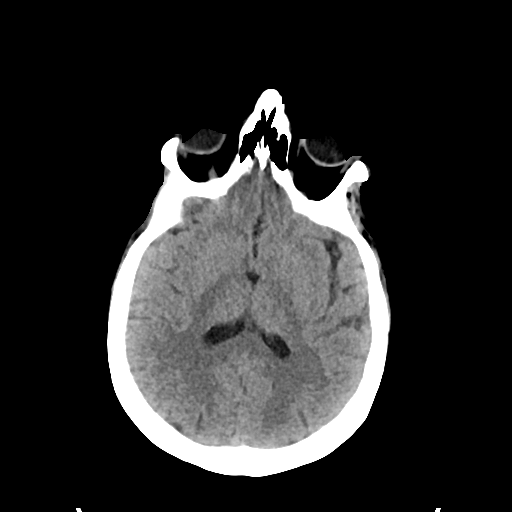
[im 23/37  brain]
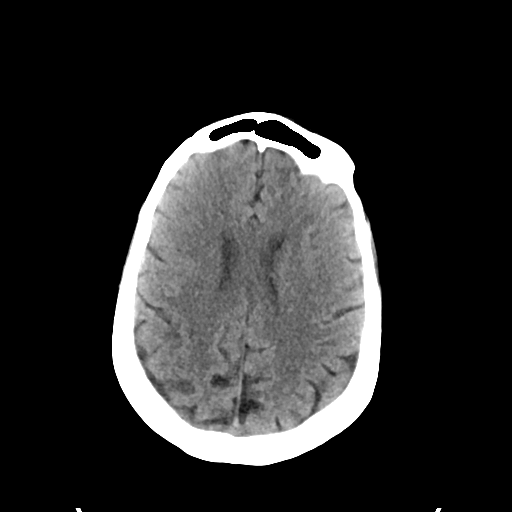
[im 23/37  bone]
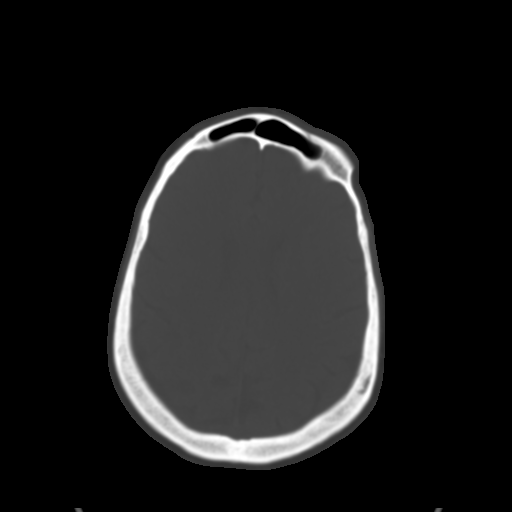
[im 28/37  brain]
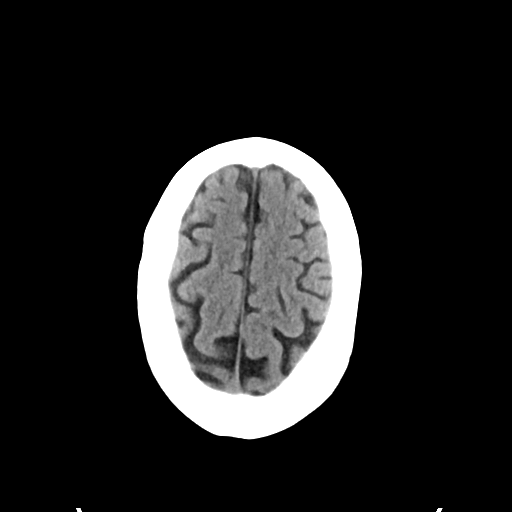
[im 32/37  brain]
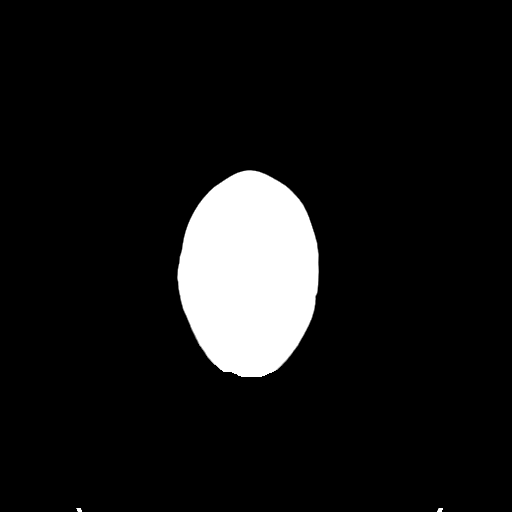

[Series 504: sag soft · sagittal · 0.35mm/px · 3 of 53 slices shown]
[im 18/53  brain]
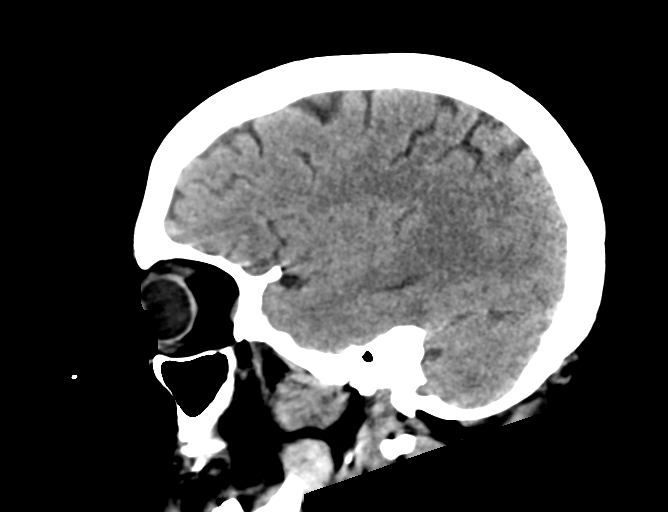
[im 27/53  brain]
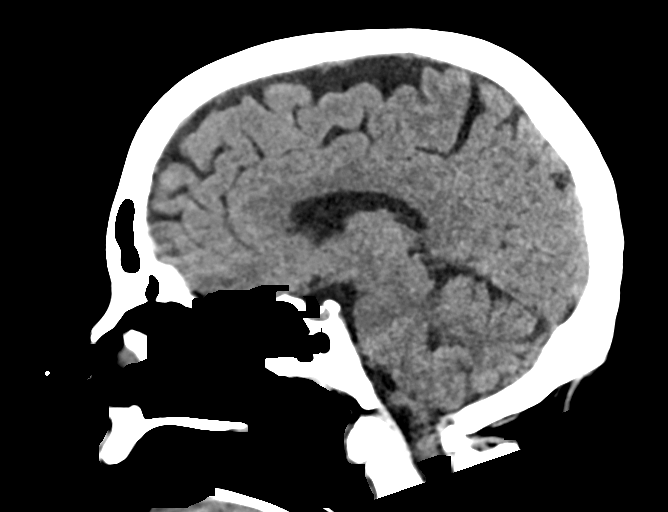
[im 35/53  brain]
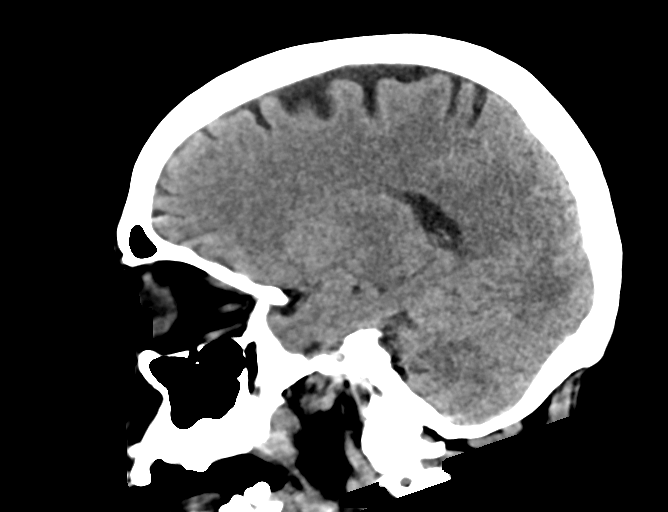

[Series 505: cor soft · coronal · 0.28mm/px · 3 of 67 slices shown]
[im 23/67  brain]
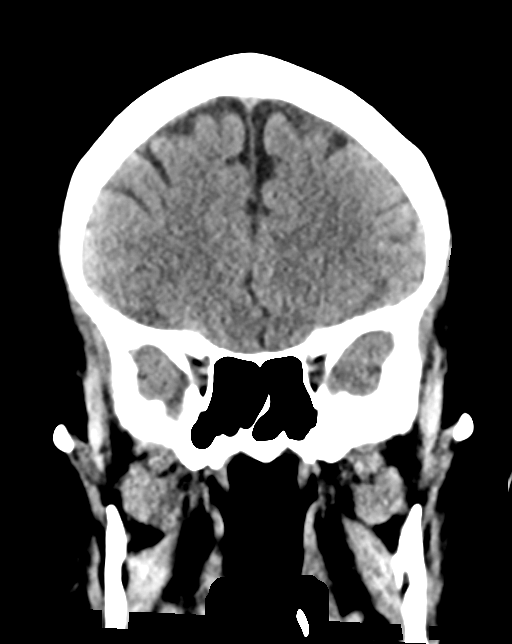
[im 30/67  brain]
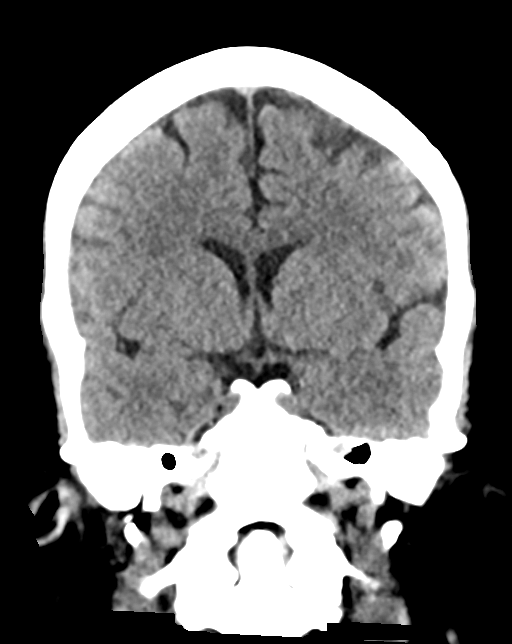
[im 37/67  brain]
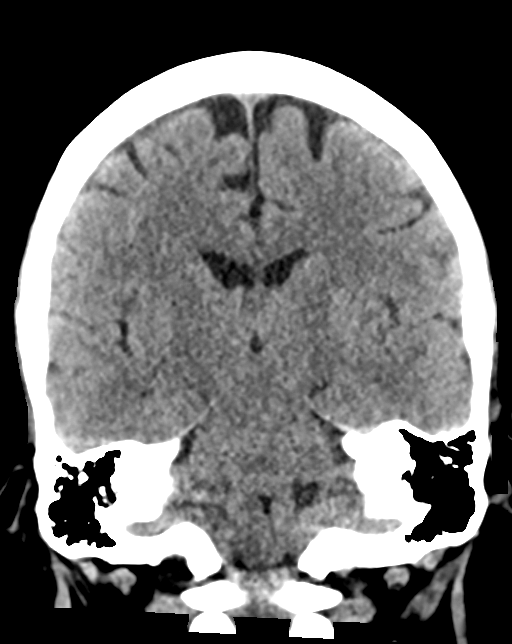

[Series 506: head bone · axial · 0.49mm/px · z∈[-131,-95]mm · 3 of 91 slices shown]
[im 10/91  bone]
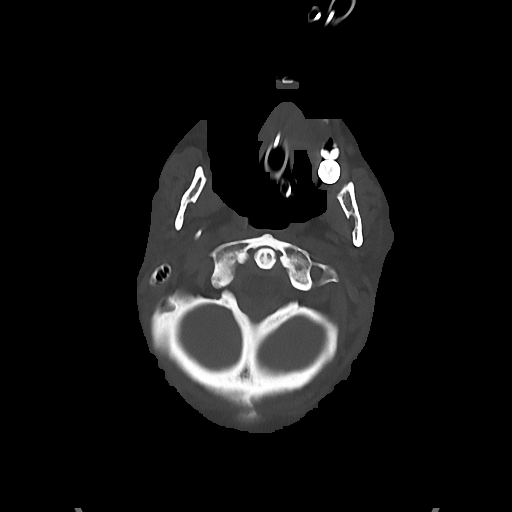
[im 19/91  bone]
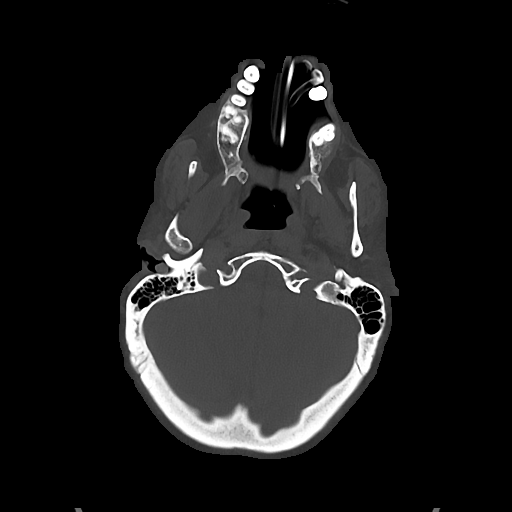
[im 28/91  bone]
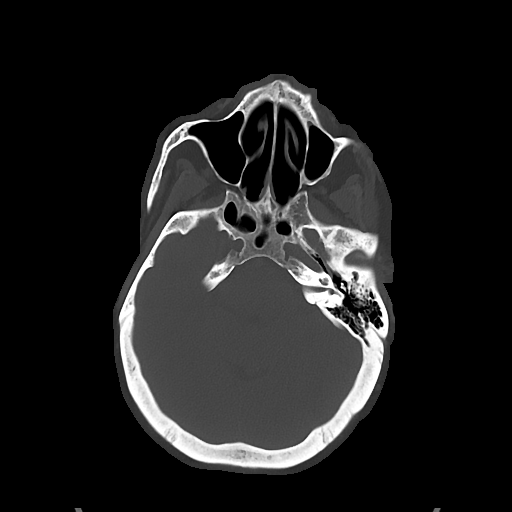

[16 of 47 positions shown; findings below may reference images not displayed]

FINDINGS: Brain: Mild cerebral volume loss, advanced for patient's age. No
acute intracranial abnormality. Specifically, no hemorrhage,
hydrocephalus, mass lesion, acute infarction, or significant
intracranial injury.

Vascular: No hyperdense vessel or unexpected calcification.

Skull: No acute calvarial abnormality.

Sinuses/Orbits: Visualized paranasal sinuses and mastoids clear.
Orbital soft tissues unremarkable.

Other: None
IMPRESSION: Mild age advanced cerebral atrophy.  No acute findings.

## 2017-12-20 IMAGING — DX DG CHEST 1V PORT
1 series · 1 of 1 positions shown · non-contrast
Comparison: 03/27/2017

CLINICAL DATA: Central line placement

EXAM:
PORTABLE CHEST 1 VIEW

[chest ap]
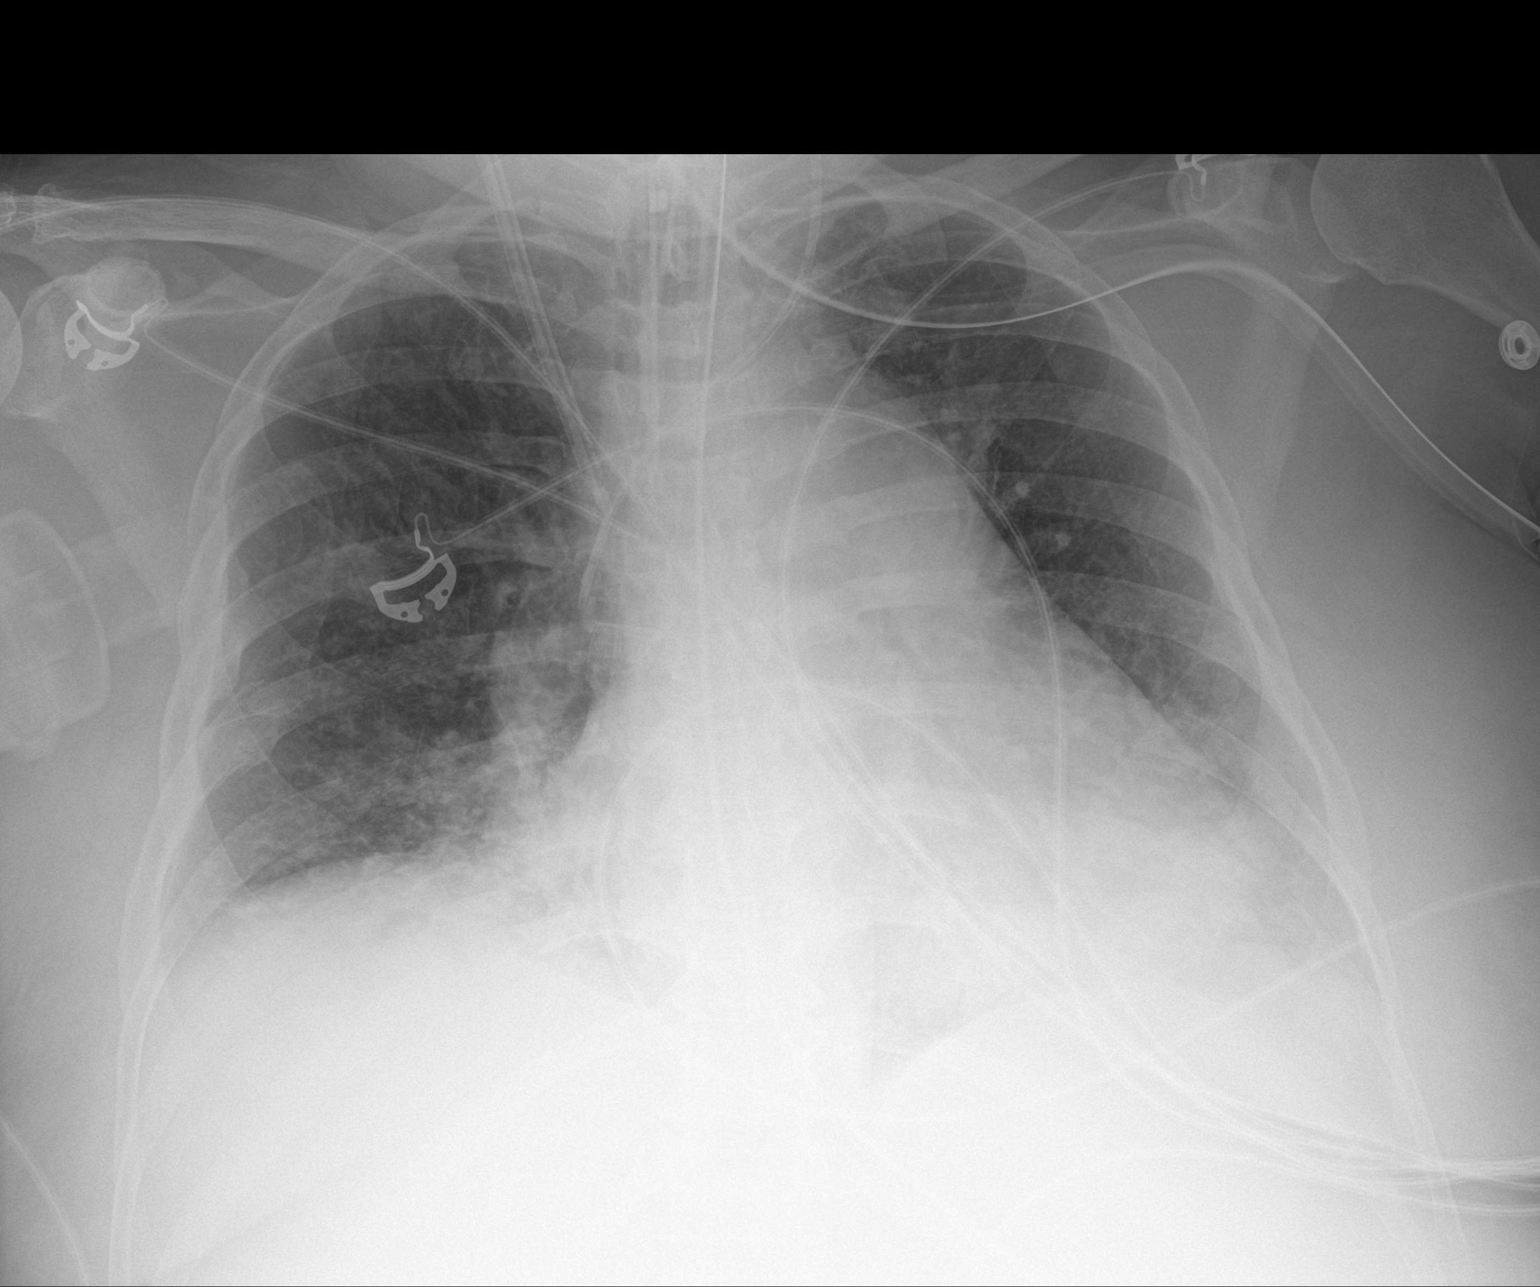

[1 of 1 positions shown; findings below may reference images not displayed]

FINDINGS: Right dialysis catheter in place with the tip in the SVC. No
pneumothorax. Remainder of the support devices are stable.
Cardiomegaly with vascular congestion. Bibasilar opacities, likely
atelectasis.
IMPRESSION: Cardiomegaly with vascular congestion.

Right dialysis catheter tip in the SVC.  No pneumothorax.

Bibasilar atelectasis.
